# Patient Record
Sex: Female | Born: 1955 | Race: White | Hispanic: No | Marital: Single | Smoking: Former smoker
Health system: Southern US, Community
[De-identification: ages and names within clinical notes are randomized; demographics above are authoritative.]

## PROBLEM LIST (undated history)

## (undated) DIAGNOSIS — F329 Major depressive disorder, single episode, unspecified: Secondary | ICD-10-CM

## (undated) DIAGNOSIS — F32A Depression, unspecified: Secondary | ICD-10-CM

## (undated) DIAGNOSIS — F988 Other specified behavioral and emotional disorders with onset usually occurring in childhood and adolescence: Secondary | ICD-10-CM

## (undated) HISTORY — PX: ABDOMINAL HYSTERECTOMY: SHX81

---

## 1997-06-25 ENCOUNTER — Inpatient Hospital Stay (HOSPITAL_COMMUNITY): Admission: AD | Admit: 1997-06-25 | Discharge: 1997-06-27 | Payer: Self-pay | Admitting: *Deleted

## 1997-08-08 ENCOUNTER — Ambulatory Visit (HOSPITAL_COMMUNITY): Admission: RE | Admit: 1997-08-08 | Discharge: 1997-08-08 | Payer: Self-pay | Admitting: Gynecology

## 1997-10-02 ENCOUNTER — Inpatient Hospital Stay (HOSPITAL_COMMUNITY): Admission: AD | Admit: 1997-10-02 | Discharge: 1997-10-09 | Payer: Self-pay | Admitting: Obstetrics and Gynecology

## 2001-02-13 ENCOUNTER — Other Ambulatory Visit: Admission: RE | Admit: 2001-02-13 | Discharge: 2001-02-13 | Payer: Self-pay | Admitting: *Deleted

## 2002-06-25 ENCOUNTER — Other Ambulatory Visit: Admission: RE | Admit: 2002-06-25 | Discharge: 2002-06-25 | Payer: Self-pay | Admitting: *Deleted

## 2004-12-20 ENCOUNTER — Encounter: Admission: RE | Admit: 2004-12-20 | Discharge: 2004-12-20 | Payer: Self-pay | Admitting: Orthopedic Surgery

## 2006-01-18 ENCOUNTER — Other Ambulatory Visit: Admission: RE | Admit: 2006-01-18 | Discharge: 2006-01-18 | Payer: Self-pay | Admitting: Gynecology

## 2006-02-05 ENCOUNTER — Encounter: Admission: RE | Admit: 2006-02-05 | Discharge: 2006-02-05 | Payer: Self-pay | Admitting: Gynecology

## 2009-05-08 ENCOUNTER — Emergency Department (HOSPITAL_COMMUNITY): Admission: EM | Admit: 2009-05-08 | Discharge: 2009-05-08 | Payer: Self-pay | Admitting: Family Medicine

## 2010-05-24 ENCOUNTER — Ambulatory Visit (HOSPITAL_BASED_OUTPATIENT_CLINIC_OR_DEPARTMENT_OTHER)
Admission: RE | Admit: 2010-05-24 | Discharge: 2010-05-24 | Disposition: A | Discharge status: Home / Self Care | Payer: 59 | Source: Ambulatory Visit | Attending: Oral Surgery | Admitting: Oral Surgery

## 2010-05-24 ENCOUNTER — Other Ambulatory Visit: Payer: Self-pay | Admitting: Oral Surgery

## 2010-05-24 DIAGNOSIS — K006 Disturbances in tooth eruption: Secondary | ICD-10-CM | POA: Insufficient documentation

## 2010-05-24 DIAGNOSIS — K090 Developmental odontogenic cysts: Secondary | ICD-10-CM | POA: Insufficient documentation

## 2010-05-24 DIAGNOSIS — F411 Generalized anxiety disorder: Secondary | ICD-10-CM | POA: Insufficient documentation

## 2010-05-24 DIAGNOSIS — Z01812 Encounter for preprocedural laboratory examination: Secondary | ICD-10-CM | POA: Insufficient documentation

## 2010-05-24 DIAGNOSIS — K219 Gastro-esophageal reflux disease without esophagitis: Secondary | ICD-10-CM | POA: Insufficient documentation

## 2010-05-24 DIAGNOSIS — G43909 Migraine, unspecified, not intractable, without status migrainosus: Secondary | ICD-10-CM | POA: Insufficient documentation

## 2010-05-24 DIAGNOSIS — Z01810 Encounter for preprocedural cardiovascular examination: Secondary | ICD-10-CM | POA: Insufficient documentation

## 2010-05-24 LAB — POCT HEMOGLOBIN-HEMACUE: Hemoglobin: 12.7 g/dL (ref 12.0–15.0)

## 2010-06-05 NOTE — Op Note (Signed)
  NAMEKECHIA, YAHNKE           ACCOUNT NO.:  [id number]  MEDICAL RECORD NO.:  [id number]            PATIENT TYPE:  LOCATION:                                 FACILITY:  PHYSICIAN:  Hinton Dyer, D.D.S.    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  PROCEDURES: 1. Surgical removal of odontogenic tumor. 2. Surgical removal of impacted wisdom tooth. 3. Packing of nose.  SURGEON:  Hinton Dyer, DDS  ANESTHESIA:  General.  ASSISTANTS: 1. Montel Culver, DOMA 2. Windy Kalata, DOMA.  CONDITION AFTER SURGERY:  Good.  ESTIMATED BLOOD LOSS:  150 mL.  DESCRIPTION OF PROCEDURE:  The patient was brought and placed in a supine position in which she remained throughout the whole procedure. She was intubated via right nasal endotracheal tube and prepped and draped in the usual fashion for an intraoral procedure.  The mouth was suctioned out and a moist open 4 x 4 gauze was placed around the endotracheal tube.  Two carpules of 2% Xylocaine with 1:100,000 epinephrine was given in the mucobuccal fold and palate.  A #15 blade then made an incision over the edentulous area extending to tooth #12. A full-thickness mucoperiosteal flap was elevated with a periosteal elevator.  Immediately, a large perforation was noted into the maxillary sinus.  The bone that was present was abnormal in texture.  That was removed with a rongeur.  A large brownish colored thick material was encountered.  This was suctioned out.  A 3-4 cm x 3-4 cm tumor was removed that was attached within the maxillary sinus.  The impacted wisdom tooth was associated and in the center of this tumor.  The soft tissue lining of the maxillary sinus was then removed at the same time and all of that was sent to pathology.  The area was irrigated copiously and  examined for any remnants of soft tissue.  The flap was then repositioned and closed with multiple 4-0 Vicryl sutures.  Because of the extensive bleeding coming from the nose which was coming from the left maxillary sinus, the area was packed with Telfa covered in bacitracin.  One 2-0 black silk suture was passed through the septum and tied off to hold the packing in place.  The patient was then extubated on the table and returned to the recovery room in good condition.  She is going to be kept on amoxicillin 500 mg x28, one q.i.d. and hydrocodone 5/500 x20, one or two q.4 h. p.r.n. pain.  She will be followed by me in my private office and possible further surgery will be discussed.          ______________________________ Hinton Dyer, D.D.S.     JLM/MEDQ  D:  05/24/2010  T:  05/25/2010  Job:  [id number]  Electronically Signed by Royston Sinner D.D.S. on 06/05/2010 12:53:29 PM

## 2010-07-13 ENCOUNTER — Other Ambulatory Visit: Payer: Self-pay | Admitting: Oral Surgery

## 2010-07-13 ENCOUNTER — Ambulatory Visit (HOSPITAL_BASED_OUTPATIENT_CLINIC_OR_DEPARTMENT_OTHER)
Admission: RE | Admit: 2010-07-13 | Discharge: 2010-07-13 | Disposition: A | Discharge status: Home / Self Care | Payer: 59 | Source: Ambulatory Visit | Attending: Oral Surgery | Admitting: Oral Surgery

## 2010-07-13 DIAGNOSIS — K090 Developmental odontogenic cysts: Secondary | ICD-10-CM | POA: Insufficient documentation

## 2010-07-13 DIAGNOSIS — F329 Major depressive disorder, single episode, unspecified: Secondary | ICD-10-CM | POA: Insufficient documentation

## 2010-07-13 DIAGNOSIS — F3289 Other specified depressive episodes: Secondary | ICD-10-CM | POA: Insufficient documentation

## 2010-07-13 DIAGNOSIS — F411 Generalized anxiety disorder: Secondary | ICD-10-CM | POA: Insufficient documentation

## 2010-07-13 DIAGNOSIS — Z01812 Encounter for preprocedural laboratory examination: Secondary | ICD-10-CM | POA: Insufficient documentation

## 2010-07-13 LAB — POCT HEMOGLOBIN-HEMACUE: Hemoglobin: 11.9 g/dL — ABNORMAL LOW (ref 12.0–15.0)

## 2010-12-07 NOTE — Op Note (Signed)
Donna Todd, Donna Todd           ACCOUNT NO.:  [id number]  MEDICAL RECORD NO.:  [id number]          PATIENT TYPE:  LOCATION:                                 FACILITY:  PHYSICIAN:  Hinton Dyer, D.D.S.DATE OF BIRTH:  1955/07/10  DATE OF PROCEDURE:  07/13/2010 DATE OF DISCHARGE:                              OPERATIVE REPORT   PREOPERATIVE DIAGNOSIS:  Odontogenic keratocyst.  POSTOPERATIVE DIAGNOSIS:  Odontogenic keratocyst.  PROCEDURE:  Debridement of left sinus and chemical cautery with coronoid solution.  SURGEON:  Hinton Dyer, DDS  ASSISTANTS:  Angelia Mould and Montel Culver.  ANESTHESIA:  General.  ESTIMATED BLOOD LOSS:  30 mL.  Condition and surgery was good.  Following preop medication, the patient was brought and placed in supine position which remained throughout the whole procedure.  She was then intubated via right nasal endotracheal tube and prepped and draped in the usual fashion for an intraoral procedure.  The face and intraoral cavity were prepared with Betadine scrub and paint and the throat and endotracheal tube was protected with an open moist 4x4 gauze.  Xylocaine 1.5 mL of 2% with 1:100,000 epinephrine was given in the left mucobuccal fold from the canine area back to the wisdom tooth area.  A chronic oral antral opening was visualized and excised using an 15 blade and an Adson forceps.  The incision was carried from the distal of the left maxillary tuberosity to the left canine area.  A full-thickness buccal flap was elevated with a periosteal elevator.  The contents of the fistula as well as all soft tissue encountered within the sinus was submitted for pathological examination.  The sinus was curetted out and tissue was removed.  The distal tissue appeared more fibrous in nature with some sinus membrane.  The anterior portion had polypoid tissue.  All of this was submitted for pathological examination.  Once sinus was completely clean, the  area was irrigated out and checked for any residual tissue. It was then filled with coronoid solution approximately 5 mL.  It was left in place for 5 minutes by the clock.  The solution was then removed using suction.  The area was then copiously irrigated with several hundred mL of saline.  During the time that the solution was within the sinus, the mouth was also packed off.  One-half-inch Iodoform gauze mixed with Benzoin and Neosporin was then packed into the sinus from the distal aspect forward until the whole sinus was obliterated.  A small punch incision was made in the canine area and a hemostat was used to retrieve the end of the gauze and bring it out into the mucobuccal fold. A 4-0 Vicryl suture was then used to suture the whole defect closed so there was no longer an oral antral opening and all the soft tissue was sealed.  The end of the gauze was also sutured in place with a 4-0 Vicryl suture.  The mouth was irrigated out and suctioned, the throat was suctioned out, the nasopharynx was suctioned out and the throat pack was removed.  The whole area was also copiously irrigated prior to suctioning and after the packing was  removed.  The patient was then extubated on the table and returned to the recovery room in good condition.  She will be sent home with a prescription for Keflex 500 mg x28 one q.i.d., Vicodin x 20, one or two q.4 h. p.r.n. pain and Mucinex, she will have the pack advance then partially remove starting Monday.          ______________________________ Hinton Dyer, D.D.S.     JLM/MEDQ  D:  07/13/2010  T:  07/13/2010  Job:  [id number]  Electronically Signed by Royston Sinner D.D.S. on 12/07/2010 10:58:07 AM

## 2013-12-17 DIAGNOSIS — M549 Dorsalgia, unspecified: Secondary | ICD-10-CM | POA: Insufficient documentation

## 2013-12-17 DIAGNOSIS — G4700 Insomnia, unspecified: Secondary | ICD-10-CM | POA: Insufficient documentation

## 2013-12-17 DIAGNOSIS — R300 Dysuria: Secondary | ICD-10-CM | POA: Insufficient documentation

## 2013-12-17 DIAGNOSIS — F17200 Nicotine dependence, unspecified, uncomplicated: Secondary | ICD-10-CM | POA: Insufficient documentation

## 2013-12-17 DIAGNOSIS — F32A Depression, unspecified: Secondary | ICD-10-CM | POA: Insufficient documentation

## 2013-12-17 DIAGNOSIS — K219 Gastro-esophageal reflux disease without esophagitis: Secondary | ICD-10-CM | POA: Insufficient documentation

## 2013-12-17 DIAGNOSIS — N390 Urinary tract infection, site not specified: Secondary | ICD-10-CM | POA: Insufficient documentation

## 2013-12-17 DIAGNOSIS — N952 Postmenopausal atrophic vaginitis: Secondary | ICD-10-CM | POA: Insufficient documentation

## 2013-12-17 DIAGNOSIS — E663 Overweight: Secondary | ICD-10-CM | POA: Insufficient documentation

## 2013-12-17 DIAGNOSIS — F520 Hypoactive sexual desire disorder: Secondary | ICD-10-CM | POA: Insufficient documentation

## 2013-12-17 DIAGNOSIS — M25511 Pain in right shoulder: Secondary | ICD-10-CM | POA: Insufficient documentation

## 2013-12-17 DIAGNOSIS — M5412 Radiculopathy, cervical region: Secondary | ICD-10-CM | POA: Insufficient documentation

## 2013-12-17 DIAGNOSIS — F4321 Adjustment disorder with depressed mood: Secondary | ICD-10-CM | POA: Insufficient documentation

## 2013-12-17 DIAGNOSIS — E8940 Asymptomatic postprocedural ovarian failure: Secondary | ICD-10-CM | POA: Insufficient documentation

## 2013-12-17 DIAGNOSIS — N951 Menopausal and female climacteric states: Secondary | ICD-10-CM | POA: Insufficient documentation

## 2013-12-17 DIAGNOSIS — B009 Herpesviral infection, unspecified: Secondary | ICD-10-CM | POA: Insufficient documentation

## 2013-12-17 DIAGNOSIS — B3731 Acute candidiasis of vulva and vagina: Secondary | ICD-10-CM | POA: Insufficient documentation

## 2013-12-17 DIAGNOSIS — M79609 Pain in unspecified limb: Secondary | ICD-10-CM | POA: Insufficient documentation

## 2013-12-17 DIAGNOSIS — R12 Heartburn: Secondary | ICD-10-CM | POA: Insufficient documentation

## 2013-12-17 DIAGNOSIS — M2040 Other hammer toe(s) (acquired), unspecified foot: Secondary | ICD-10-CM | POA: Insufficient documentation

## 2015-03-14 DIAGNOSIS — N6089 Other benign mammary dysplasias of unspecified breast: Secondary | ICD-10-CM | POA: Insufficient documentation

## 2015-03-14 DIAGNOSIS — L84 Corns and callosities: Secondary | ICD-10-CM | POA: Insufficient documentation

## 2015-03-14 DIAGNOSIS — M7502 Adhesive capsulitis of left shoulder: Secondary | ICD-10-CM | POA: Insufficient documentation

## 2015-03-14 DIAGNOSIS — J309 Allergic rhinitis, unspecified: Secondary | ICD-10-CM | POA: Insufficient documentation

## 2015-03-14 DIAGNOSIS — M19019 Primary osteoarthritis, unspecified shoulder: Secondary | ICD-10-CM | POA: Insufficient documentation

## 2015-03-14 DIAGNOSIS — G8929 Other chronic pain: Secondary | ICD-10-CM | POA: Insufficient documentation

## 2015-03-14 DIAGNOSIS — K645 Perianal venous thrombosis: Secondary | ICD-10-CM | POA: Insufficient documentation

## 2015-03-14 DIAGNOSIS — F419 Anxiety disorder, unspecified: Secondary | ICD-10-CM | POA: Insufficient documentation

## 2015-06-28 DIAGNOSIS — F4322 Adjustment disorder with anxiety: Secondary | ICD-10-CM | POA: Insufficient documentation

## 2016-01-12 DIAGNOSIS — B9689 Other specified bacterial agents as the cause of diseases classified elsewhere: Secondary | ICD-10-CM | POA: Insufficient documentation

## 2016-01-12 DIAGNOSIS — N760 Acute vaginitis: Secondary | ICD-10-CM | POA: Insufficient documentation

## 2016-04-06 DIAGNOSIS — Z0000 Encounter for general adult medical examination without abnormal findings: Secondary | ICD-10-CM | POA: Insufficient documentation

## 2016-05-31 DIAGNOSIS — G44309 Post-traumatic headache, unspecified, not intractable: Secondary | ICD-10-CM | POA: Insufficient documentation

## 2016-08-15 DIAGNOSIS — Z111 Encounter for screening for respiratory tuberculosis: Secondary | ICD-10-CM | POA: Insufficient documentation

## 2016-08-31 ENCOUNTER — Other Ambulatory Visit: Payer: Self-pay | Admitting: Internal Medicine

## 2016-08-31 ENCOUNTER — Ambulatory Visit
Admission: RE | Admit: 2016-08-31 | Discharge: 2016-08-31 | Disposition: A | Discharge status: Home / Self Care | Payer: No Typology Code available for payment source | Source: Ambulatory Visit | Attending: Internal Medicine | Admitting: Internal Medicine

## 2016-08-31 DIAGNOSIS — Z111 Encounter for screening for respiratory tuberculosis: Secondary | ICD-10-CM

## 2017-07-17 DIAGNOSIS — F988 Other specified behavioral and emotional disorders with onset usually occurring in childhood and adolescence: Secondary | ICD-10-CM | POA: Insufficient documentation

## 2017-11-17 ENCOUNTER — Other Ambulatory Visit: Payer: Self-pay

## 2017-11-17 ENCOUNTER — Emergency Department (HOSPITAL_COMMUNITY): Payer: 59

## 2017-11-17 ENCOUNTER — Observation Stay (HOSPITAL_COMMUNITY)
Admission: EM | Admit: 2017-11-17 | Discharge: 2017-11-18 | Disposition: A | Discharge status: Home / Self Care | Payer: 59 | Attending: Internal Medicine | Admitting: Internal Medicine

## 2017-11-17 ENCOUNTER — Encounter (HOSPITAL_COMMUNITY): Payer: Self-pay

## 2017-11-17 DIAGNOSIS — F17210 Nicotine dependence, cigarettes, uncomplicated: Secondary | ICD-10-CM | POA: Diagnosis not present

## 2017-11-17 DIAGNOSIS — F988 Other specified behavioral and emotional disorders with onset usually occurring in childhood and adolescence: Secondary | ICD-10-CM | POA: Diagnosis not present

## 2017-11-17 DIAGNOSIS — R748 Abnormal levels of other serum enzymes: Secondary | ICD-10-CM | POA: Diagnosis not present

## 2017-11-17 DIAGNOSIS — Z79890 Hormone replacement therapy (postmenopausal): Secondary | ICD-10-CM | POA: Insufficient documentation

## 2017-11-17 DIAGNOSIS — R112 Nausea with vomiting, unspecified: Secondary | ICD-10-CM

## 2017-11-17 DIAGNOSIS — Z720 Tobacco use: Secondary | ICD-10-CM

## 2017-11-17 DIAGNOSIS — F329 Major depressive disorder, single episode, unspecified: Secondary | ICD-10-CM | POA: Diagnosis not present

## 2017-11-17 DIAGNOSIS — Z780 Asymptomatic menopausal state: Secondary | ICD-10-CM | POA: Diagnosis not present

## 2017-11-17 DIAGNOSIS — F1290 Cannabis use, unspecified, uncomplicated: Secondary | ICD-10-CM | POA: Insufficient documentation

## 2017-11-17 DIAGNOSIS — R7989 Other specified abnormal findings of blood chemistry: Principal | ICD-10-CM | POA: Insufficient documentation

## 2017-11-17 DIAGNOSIS — R778 Other specified abnormalities of plasma proteins: Secondary | ICD-10-CM | POA: Diagnosis present

## 2017-11-17 DIAGNOSIS — R911 Solitary pulmonary nodule: Secondary | ICD-10-CM | POA: Diagnosis not present

## 2017-11-17 DIAGNOSIS — Z79899 Other long term (current) drug therapy: Secondary | ICD-10-CM | POA: Insufficient documentation

## 2017-11-17 DIAGNOSIS — Z90710 Acquired absence of both cervix and uterus: Secondary | ICD-10-CM | POA: Diagnosis not present

## 2017-11-17 DIAGNOSIS — Z882 Allergy status to sulfonamides status: Secondary | ICD-10-CM | POA: Diagnosis not present

## 2017-11-17 DIAGNOSIS — I214 Non-ST elevation (NSTEMI) myocardial infarction: Secondary | ICD-10-CM

## 2017-11-17 HISTORY — DX: Other specified behavioral and emotional disorders with onset usually occurring in childhood and adolescence: F98.8

## 2017-11-17 HISTORY — DX: Depression, unspecified: F32.A

## 2017-11-17 HISTORY — DX: Major depressive disorder, single episode, unspecified: F32.9

## 2017-11-17 LAB — CBC
HCT: 40.7 % (ref 36.0–46.0)
Hemoglobin: 13.1 g/dL (ref 12.0–15.0)
MCH: 30.3 pg (ref 26.0–34.0)
MCHC: 32.2 g/dL (ref 30.0–36.0)
MCV: 94 fL (ref 78.0–100.0)
Platelets: 304 10*3/uL (ref 150–400)
RBC: 4.33 MIL/uL (ref 3.87–5.11)
RDW: 13.3 % (ref 11.5–15.5)
WBC: 12.5 10*3/uL — ABNORMAL HIGH (ref 4.0–10.5)

## 2017-11-17 LAB — RAPID URINE DRUG SCREEN, HOSP PERFORMED
Amphetamines: NOT DETECTED
Barbiturates: NOT DETECTED
Benzodiazepines: NOT DETECTED
Cocaine: NOT DETECTED
Opiates: NOT DETECTED
Tetrahydrocannabinol: POSITIVE — AB

## 2017-11-17 LAB — COMPREHENSIVE METABOLIC PANEL
ALT: 16 U/L (ref 0–44)
AST: 24 U/L (ref 15–41)
Albumin: 4.1 g/dL (ref 3.5–5.0)
Alkaline Phosphatase: 49 U/L (ref 38–126)
Anion gap: 9 (ref 5–15)
BUN: 15 mg/dL (ref 8–23)
CO2: 24 mmol/L (ref 22–32)
Calcium: 9.2 mg/dL (ref 8.9–10.3)
Chloride: 108 mmol/L (ref 98–111)
Creatinine, Ser: 0.86 mg/dL (ref 0.44–1.00)
GFR calc Af Amer: >60 mL/min (ref >60)
GFR calc non Af Amer: >60 mL/min (ref >60)
Glucose, Bld: 121 mg/dL — ABNORMAL HIGH (ref 70–99)
Potassium: 3.6 mmol/L (ref 3.5–5.1)
Sodium: 141 mmol/L (ref 135–145)
Total Bilirubin: 0.9 mg/dL (ref 0.3–1.2)
Total Protein: 7 g/dL (ref 6.5–8.1)

## 2017-11-17 LAB — I-STAT TROPONIN, ED
Troponin i, poc: 0.12 ng/mL (ref 0.00–0.08)
Troponin i, poc: 0.16 ng/mL (ref 0.00–0.08)

## 2017-11-17 LAB — TROPONIN I
Troponin I: 0.12 ng/mL (ref <0.03)
Troponin I: 0.11 ng/mL (ref <0.03)

## 2017-11-17 LAB — HEPARIN LEVEL (UNFRACTIONATED): Heparin Unfractionated: 0.41 IU/mL (ref 0.30–0.70)

## 2017-11-17 LAB — CBG MONITORING, ED: Glucose-Capillary: 115 mg/dL — ABNORMAL HIGH (ref 70–99)

## 2017-11-17 MED ORDER — ASPIRIN 81 MG PO CHEW
324.0000 mg | CHEWABLE_TABLET | Freq: Once | ORAL | Status: AC
  Administered 2017-11-17: 324 mg via ORAL
  Filled 2017-11-17: qty 4

## 2017-11-17 MED ORDER — ONDANSETRON HCL 4 MG/2ML IJ SOLN
4.0000 mg | Freq: Four times a day (QID) | INTRAMUSCULAR | Status: DC | PRN
  Administered 2017-11-17 – 2017-11-18 (×3): 4 mg via INTRAVENOUS
  Filled 2017-11-17 (×3): qty 2

## 2017-11-17 MED ORDER — MORPHINE SULFATE (PF) 2 MG/ML IV SOLN: 2.0000 mg | INTRAVENOUS | Status: DC | PRN

## 2017-11-17 MED ORDER — HEPARIN (PORCINE) IN NACL 100-0.45 UNIT/ML-% IJ SOLN
750.0000 [IU]/h | INTRAMUSCULAR | Status: DC
  Administered 2017-11-17: 650 [IU]/h via INTRAVENOUS
  Filled 2017-11-17: qty 250

## 2017-11-17 MED ORDER — ACETAMINOPHEN 325 MG PO TABS: 650.0000 mg | ORAL_TABLET | ORAL | Status: DC | PRN

## 2017-11-17 MED ORDER — ASPIRIN EC 325 MG PO TBEC
325.0000 mg | DELAYED_RELEASE_TABLET | Freq: Every day | ORAL | Status: DC
  Administered 2017-11-17 – 2017-11-18 (×2): 325 mg via ORAL
  Filled 2017-11-17 (×2): qty 1

## 2017-11-17 MED ORDER — IOPAMIDOL (ISOVUE-370) INJECTION 76%
INTRAVENOUS | Status: AC
  Administered 2017-11-17: 100 mL
  Filled 2017-11-17: qty 100

## 2017-11-17 MED ORDER — PANTOPRAZOLE SODIUM 40 MG PO TBEC
40.0000 mg | DELAYED_RELEASE_TABLET | Freq: Every day | ORAL | Status: DC
  Administered 2017-11-17 – 2017-11-18 (×2): 40 mg via ORAL
  Filled 2017-11-17 (×2): qty 1

## 2017-11-17 MED ORDER — HEPARIN BOLUS VIA INFUSION
3000.0000 [IU] | Freq: Once | INTRAVENOUS | Status: AC
  Administered 2017-11-17: 3000 [IU] via INTRAVENOUS
  Filled 2017-11-17: qty 3000

## 2017-11-17 MED ORDER — DULOXETINE HCL 30 MG PO CPEP
30.0000 mg | ORAL_CAPSULE | Freq: Every day | ORAL | Status: DC
  Administered 2017-11-18: 30 mg via ORAL
  Filled 2017-11-17: qty 1

## 2017-11-17 MED ORDER — METOPROLOL TARTRATE 25 MG PO TABS
25.0000 mg | ORAL_TABLET | Freq: Two times a day (BID) | ORAL | Status: DC
  Filled 2017-11-17: qty 1

## 2017-11-17 MED ORDER — GI COCKTAIL ~~LOC~~: 30.0000 mL | Freq: Four times a day (QID) | ORAL | Status: DC | PRN

## 2017-11-17 NOTE — ED Notes (Signed)
Admitting team bedside for evaluation and planning

## 2017-11-17 NOTE — Consult Note (Addendum)
Cardiology Consultation:   Patient ID: Donna Todd; [id number]; September 08, 1955   Admit date: 11/17/2017 Date of Consult: 11/17/2017  Primary Care Provider: Elisabeth Cara, PA-C Primary Cardiologist: New to [facility]   Patient Profile:   Donna Todd is a 62 y.o. female with a history of depression and attention deficit disorder as well as tobacco abuse who is being seen today for the evaluation of abnormal troponin I level at the request of Dr. Ronnald Nian.  History of Present Illness:   Ms. Tomson presents to the ER with her sister and significant other, having been referred there after visit to urgent care earlier today.  She states that she went out to eat with some friends on Friday evening, had [place] food and other alcoholic beverages. She contacted her significant other around midnight and suddenly became nauseated with an episode of emesis.  She states that she has had recurring nausea, has actually forced emesis since Saturday and into today.  She does not report any actual chest discomfort, breathlessness, or palpitations during this time.  States that she is felt weak.  She has not eaten any food since Friday evening.  She was seen at [facility] she was diagnosed with potential gastroenteritis and prescribed Zofran.  ECG is described as showing sinus rhythm at rate 51 bpm with RSR prime in lead V1 and U waves.  Apparently troponin I level was drawn as part of her work-up with finding of abnormal range at 0.124 and she was contacted to come to the hospital.  Baseline she does not report any exertional symptoms or limitations.  She states that she works at [employer] job, mainly computer work.  She has no obvious family history of premature CAD, states that her mother did have "stents" in her 40s or 63s in the setting of other health problems.  Personal cardiac risk factors include tobacco abuse, possibly hyperlipidemia which she manages by  diet and also krill oil.  He is on an estrogen patch at home, also uses Ritalin intermittently and is on Cymbalta per mental health care provider.  I personally reviewed her ECG today which shows sinus bradycardia with nonspecific ST-T changes in the setting of some lead motion.  There is a small R' in lead V1 and V2. Also prolonged QT interval versus TU fusion.  There is no old tracing for comparison.  Chest CT obtained in the ER noted a right upper lobe 8 mm nodule with mild spiculation, otherwise no acute findings.  Thoracic aorta was described as normal range and there was mention of no atherosclerotic calcifications.  Otherwise no recent health changes other than reported UTI within the last 2 weeks treated with Cipro.  Past Medical History:  Diagnosis Date  . Attention deficit disorder   . Depression     Past Surgical History:  Procedure Laterality Date  . ABDOMINAL HYSTERECTOMY       Outpatient Medications: No current facility-administered medications on file prior to encounter.    Current Outpatient Medications on File Prior to Encounter  Medication Sig Dispense Refill  . Biotin w/ Vitamins C & E (HAIR/SKIN/NAILS PO) Take 1 tablet by mouth daily.    . DULoxetine (CYMBALTA) 30 MG capsule Take 30 mg by mouth daily.    Marland Kitchen estradiol (VIVELLE-DOT) 0.05 MG/24HR patch Place 1 patch onto the skin 2 (two) times a week.    Marland Kitchen KRILL OIL PO Take 1 tablet by mouth daily.    . ondansetron (  ZOFRAN-ODT) 4 MG disintegrating tablet Take 4-8 mg by mouth every 8 (eight) hours as needed for nausea.    Marland Kitchen omeprazole (PRILOSEC) 20 MG capsule Take 20 mg by mouth daily. For 7 days      Allergies:    Allergies  Allergen Reactions  . Sulfa Antibiotics Other (See Comments)    MOUTH ULCERS    Social History:   Social History   Socioeconomic History  . Marital status: Legally Separated    Spouse name: Not on file  . Number of children: Not on file  . Years of education: Not on file  . Highest  education level: Not on file  Occupational History  . Not on file  Social Needs  . Financial resource strain: Not on file  . Food insecurity:    Worry: Not on file    Inability: Not on file  . Transportation needs:    Medical: Not on file    Non-medical: Not on file  Tobacco Use  . Smoking status: Current Some Day Smoker    Packs/day: 1.00    Years: 25.00    Pack years: 25.00    Types: Cigarettes  . Smokeless tobacco: Never Used  Substance and Sexual Activity  . Alcohol use: Yes    Alcohol/week: 6.0 standard drinks    Types: 6 Cans of beer per week  . Drug use: Not on file  . Sexual activity: Not on file  Lifestyle  . Physical activity:    Days per week: Not on file    Minutes per session: Not on file  . Stress: Not on file  Relationships  . Social connections:    Talks on phone: Not on file    Gets together: Not on file    Attends religious service: Not on file    Active member of club or organization: Not on file    Attends meetings of clubs or organizations: Not on file    Relationship status: Not on file  . Intimate partner violence:    Fear of current or ex partner: Not on file    Emotionally abused: Not on file    Physically abused: Not on file    Forced sexual activity: Not on file  Other Topics Concern  . Not on file  Social History Narrative   Sedentary job works at a computer    Family History:   The patient's family history includes Diabetes Mellitus II in her mother; Skin cancer in her mother.  ROS:  Please see the history of present illness.  Reports trouble focusing for which she uses Ritalin intermittently.  All other ROS reviewed and negative.     Physical Exam/Data:   Vitals:   11/17/17 1235 11/17/17 1330 11/17/17 1400 11/17/17 1430  BP: (!) 148/76 (!) 150/70 (!) 144/78 (!) 153/68  Pulse: (!) 51 (!) 51 (!) 51 (!) 55  Resp: 12 11 12    Temp: 99.3 F (37.4 C)     TempSrc: Oral     SpO2: 100%     Weight: 56.2 kg     Height: 5\' 4"  (1.626  m)      No intake or output data in the 24 hours ending 11/17/17 1709 Filed Weights   11/17/17 1235  Weight: 56.2 kg   Body mass index is 21.28 kg/m.   Gen: Patient appears comfortable at rest. HEENT: Conjunctiva and lids normal, oropharynx clear. Neck: Supple, no elevated JVP or carotid bruits, no thyromegaly. Lungs: Clear to auscultation, nonlabored breathing  at rest. Cardiac: Regular rate and rhythm, no S3 or significant systolic murmur, no pericardial rub. Abdomen: Soft, nontender, bowel sounds present, no guarding or rebound. Extremities: No pitting edema, distal pulses 2+. Skin: Warm and dry. Musculoskeletal: No kyphosis. Neuropsychiatric: Alert and oriented x3, affect grossly appropriate.  Telemetry:  I personally reviewed telemetry which shows sinus rhythm.  Relevant CV Studies:  No prior cardiac testing.  Laboratory Data:  Chemistry Recent Labs  Lab 11/17/17 1243  NA 141  K 3.6  CL 108  CO2 24  GLUCOSE 121*  BUN 15  CREATININE 0.86  CALCIUM 9.2  GFRNONAA >60  GFRAA >60  ANIONGAP 9    Recent Labs  Lab 11/17/17 1243  PROT 7.0  ALBUMIN 4.1  AST 24  ALT 16  ALKPHOS 49  BILITOT 0.9   Hematology Recent Labs  Lab 11/17/17 1243  WBC 12.5*  RBC 4.33  HGB 13.1  HCT 40.7  MCV 94.0  MCH 30.3  MCHC 32.2  RDW 13.3  PLT 304   Cardiac EnzymesNo results for input(s): TROPONINI in the last 168 hours.  Recent Labs  Lab 11/17/17 1249 11/17/17 1555  TROPIPOC 0.12* 0.16*     Radiology/Studies:  Ct Angio Chest Pe W Or Wo Contrast  Result Date: 11/17/2017 CLINICAL DATA:  Nausea and vomiting for several days EXAM: CT ANGIOGRAPHY CHEST WITH CONTRAST TECHNIQUE: Multidetector CT imaging of the chest was performed using the standard protocol during bolus administration of intravenous contrast. Multiplanar CT image reconstructions and MIPs were obtained to evaluate the vascular anatomy. CONTRAST:  132mL ISOVUE-370 IOPAMIDOL (ISOVUE-370) INJECTION 76%  COMPARISON:  None. FINDINGS: Cardiovascular: Thoracic aorta is within normal limits. No atherosclerotic calcifications are seen. No cardiac enlargement is noted. The pulmonary artery shows a normal branching pattern. No filling defects are identified to suggest pulmonary emboli. Mediastinum/Nodes: Thoracic inlet is within normal limits. No hilar or mediastinal adenopathy is noted. The esophagus is within normal limits. Lungs/Pleura: Lungs are well aerated bilaterally. In the right upper lobe there is a 8 mm mean diameter nodule with mild spiculation identified. No other nodular changes are noted. No focal infiltrate or sizable effusion is seen. Upper Abdomen: Visualized upper abdomen is within normal limits. Musculoskeletal: Bony structures are within normal limits. Review of the MIP images confirms the above findings. IMPRESSION: 8 mm mildly spiculated nodule in the right upper lobe as described. Non-contrast chest CT at 6-12 months is recommended. If the nodule is stable at time of repeat CT, then future CT at 18-24 months (from today's scan) is considered optional for low-risk patients, but is recommended for high-risk patients. This recommendation follows the consensus statement: Guidelines for Management of Incidental Pulmonary Nodules Detected on CT Images: From the Fleischner Society 2017; Radiology 2017; 284:228-243. Electronically Signed   By: Inez Catalina M.D.   On: 11/17/2017 15:24   Dg Chest Portable 1 View  Result Date: 11/17/2017 CLINICAL DATA:  Nausea/vomiting EXAM: PORTABLE CHEST 1 VIEW COMPARISON:  04/04/2017 FINDINGS: Lungs are clear.  No pleural effusion or pneumothorax. The heart is normal in size. Old left rib fracture deformities. IMPRESSION: Normal chest radiographs. Electronically Signed   By: Julian Hy M.D.   On: 11/17/2017 13:03    Assessment and Plan:   1.  Patient referred to ER after evaluation in urgent care earlier today for recent onset nausea and emesis due to  finding of abnormal troponin I levels.  Currently measurements are 0.12 and 0.16.  She is hemodynamically stable with no chest pain  at this time and her ECG is overall nonspecific.  At present the current pattern is not suggestive of ACS and her symptoms are atypical overall.  Chest CTA did not report any significant atherosclerotic calcification.  She does have a history of tobacco abuse and possibly hyperlipidemia but otherwise no known major cardiac risk factors.  She does not mention any specific illicit substance use recently.  She had been out with friends eating and having some alcoholic beverages prior to the onset of symptoms, otherwise no provocation.  2.  Tobacco abuse.  3.  Reported history of attention deficit disorder, uses Ritalin intermittently.  4.  Pro;onged QT versus TU fusion, no old tracing available for comparison.  She is on Cymbalta as an outpatient.  5.  Incidentally noted right upper lobe 8 mm lung nodule with mild spiculation.  Concerning in the setting of known tobacco abuse.  Further work-up per primary team.  Patient is being admitted to the internal medicine service for further work-up.  Would cycle a full set of cardiac markers, follow-up ECG in the morning, and also obtain an echocardiogram to define cardiac structure and function.  At this point ACS is not specifically suspected based on her presenting symptoms and current troponin I level/trend.  Consider UDS.  Our service will continue to follow with you and make additional recommendations once more information has been obtained.  Signed, Rozann Lesches, MD  11/17/2017 5:09 PM

## 2017-11-17 NOTE — Progress Notes (Signed)
ANTICOAGULATION CONSULT NOTE - Initial Consult  Pharmacy Consult for Heparin Indication: NSTEMI  Allergies  Allergen Reactions  . Sulfa Antibiotics     Patient Measurements: Height: 5\' 4"  (162.6 cm) Weight: 124 lb (56.2 kg) IBW/kg (Calculated) : 54.7 Heparin Dosing Weight: 56.2 kg  Vital Signs: Temp: 99.3 F (37.4 C) (09/15 1235) Temp Source: Oral (09/15 1235) BP: 148/76 (09/15 1235) Pulse Rate: 51 (09/15 1235)  Labs: Recent Labs    11/17/17 1243  HGB 13.1  HCT 40.7  PLT 304    CrCl cannot be calculated (No successful lab value found.).   Medical History: History reviewed. No pertinent past medical history.  Medications:  Scheduled:   Assessment: 82 YOF who presents on 11/17/17 from urgent care due to elevated troponin of 0.12. EMS states EKG was normal. Patient had been complaining of nausea for past several days. Denied any SOB, chest pain. On estrogen therapy PTA for h/o hysterectomy. Given ASA 324 mg x1 in ED. No s/sx of bleeding noted. CBC normal.  Goal of Therapy:  Heparin level 0.3-0.7 units/ml Monitor platelets by anticoagulation protocol: Yes   Plan:  -Give 3000 units bolus x 1 -Start heparin infusion at 650 units/hr -Check 6 hour heparin level -Continue to monitor H&H and platelets  Donna Todd 11/17/2017,1:23 PM

## 2017-11-17 NOTE — ED Triage Notes (Signed)
Pt arrives to ED from home with complaints of nausea and vomiting for several days. EMS reports pt saw Urgent Care yesterday and lab work was done as well as given zofran. Pt received call today stating her troponin was 0.124 and for her to come to the hospital. EMS states EKG was normal, copy placed in medical records. Pt placed in position of comfort with bed locked and lowered, call bell in reach.

## 2017-11-17 NOTE — ED Provider Notes (Addendum)
[facility]ENC[facility] DEPARTMENT Provider Note   CSN: [id number] Arrival date & time: 11/17/17  1218     History   Chief Complaint Chief Complaint  Patient presents with  . Abnormal Lab    HPI Donna Todd is a 62 y.o. female.  Patient with nausea for the last several days.  Has a history of smoking but went to urgent care today as she has felt nauseous after drinking alcohol Friday night.  Denies any diarrhea.  No abdominal pain.  Patient had elevated troponin at urgent care and was sent for further evaluation.  Patient denies any shortness of breath, chest pain.  She is on estrogen therapy after having hysterectomy in the past.  The history is provided by the patient.  Illness  This is a new problem. The current episode started more than 2 days ago. The problem occurs daily. The problem has not changed since onset.Pertinent negatives include no chest pain, no abdominal pain, no headaches and no shortness of breath. Nothing aggravates the symptoms. Nothing relieves the symptoms. She has tried nothing for the symptoms. The treatment provided no relief.    History reviewed. No pertinent past medical history.  There are no active problems to display for this patient.   Past Surgical History:  Procedure Laterality Date  . ABDOMINAL HYSTERECTOMY       OB History   None      Home Medications    Prior to Admission medications   Medication Sig Start Date End Date Taking? Authorizing Provider  Biotin w/ Vitamins C & E (HAIR/SKIN/NAILS PO) Take 1 tablet by mouth daily.   Yes [provider]  DULoxetine (CYMBALTA) 30 MG capsule Take 30 mg by mouth daily.   Yes [provider]  estradiol (VIVELLE-DOT) 0.05 MG/24HR patch Place 1 patch onto the skin 2 (two) times a week. 12/02/14  Yes [provider]  KRILL OIL PO Take 1 tablet by mouth daily.   Yes [provider]  ondansetron (ZOFRAN-ODT) 4 MG disintegrating tablet Take  4-8 mg by mouth every 8 (eight) hours as needed for nausea. 11/17/17  Yes [provider]  omeprazole (PRILOSEC) 20 MG capsule Take 20 mg by mouth daily. For 7 days 11/17/17 11/24/17  [provider]    Family History History reviewed. No pertinent family history.  Social History Social History   Tobacco Use  . Smoking status: Current Some Day Smoker  . Smokeless tobacco: Never Used  Substance Use Topics  . Alcohol use: Yes    Alcohol/week: 6.0 standard drinks    Types: 6 Cans of beer per week  . Drug use: Not on file     Allergies   Sulfa antibiotics   Review of Systems Review of Systems  Constitutional: Negative for chills and fever.  HENT: Negative for ear pain and sore throat.   Eyes: Negative for pain and visual disturbance.  Respiratory: Negative for cough and shortness of breath.   Cardiovascular: Negative for chest pain and palpitations.  Gastrointestinal: Positive for nausea. Negative for abdominal pain and vomiting.  Genitourinary: Negative for dysuria and hematuria.  Musculoskeletal: Negative for arthralgias and back pain.  Skin: Negative for color change and rash.  Neurological: Negative for seizures, syncope and headaches.  All other systems reviewed and are negative.    Physical Exam Updated Vital Signs BP (!) 153/68   Pulse (!) 55   Temp 99.3 F (37.4 C) (Oral)   Resp 12   Ht 5\' 4"  (  1.626 m)   Wt 56.2 kg   SpO2 100%   BMI 21.28 kg/m   Physical Exam  Constitutional: She is oriented to person, place, and time. She appears well-developed and well-nourished. No distress.  HENT:  Head: Normocephalic and atraumatic.  Eyes: Pupils are equal, round, and reactive to light. Conjunctivae and EOM are normal.  Neck: Normal range of motion. Neck supple.  Cardiovascular: Normal rate, regular rhythm, normal heart sounds and intact distal pulses.  No murmur heard. Pulmonary/Chest: Effort normal and breath sounds normal. No respiratory  distress.  Abdominal: Soft. There is no tenderness.  Musculoskeletal: Normal range of motion. She exhibits no edema.  Neurological: She is alert and oriented to person, place, and time.  Skin: Skin is warm and dry. Capillary refill takes less than 2 seconds.  Psychiatric: She has a normal mood and affect.  Nursing note and vitals reviewed.    ED Treatments / Results  Labs (all labs ordered are listed, but only abnormal results are displayed) Labs Reviewed  CBC - Abnormal; Notable for the following components:      Result Value   WBC 12.5 (*)    All other components within normal limits  COMPREHENSIVE METABOLIC PANEL - Abnormal; Notable for the following components:   Glucose, Bld 121 (*)    All other components within normal limits  CBG MONITORING, ED - Abnormal; Notable for the following components:   Glucose-Capillary 115 (*)    All other components within normal limits  I-STAT TROPONIN, ED - Abnormal; Notable for the following components:   Troponin i, poc 0.12 (*)    All other components within normal limits  I-STAT TROPONIN, ED - Abnormal; Notable for the following components:   Troponin i, poc 0.16 (*)    All other components within normal limits  HEPARIN LEVEL (UNFRACTIONATED)  RAPID URINE DRUG SCREEN, HOSP PERFORMED    EKG EKG Interpretation  Date/Time:  Sunday November 17 2017 12:33:40 EDT Ventricular Rate:  51 PR Interval:    QRS Duration: 100 QT Interval:  522 QTC Calculation: 481 R Axis:   60 Text Interpretation:  Sinus rhythm Consider left atrial enlargement RSR' in V1 or V2, right VCD or RVH Confirmed by Lennice Sites [phone]) on 11/17/2017 12:58:51 PM   Radiology Ct Angio Chest Pe W Or Wo Contrast  Result Date: 11/17/2017 CLINICAL DATA:  Nausea and vomiting for several days EXAM: CT ANGIOGRAPHY CHEST WITH CONTRAST TECHNIQUE: Multidetector CT imaging of the chest was performed using the standard protocol during bolus administration of intravenous  contrast. Multiplanar CT image reconstructions and MIPs were obtained to evaluate the vascular anatomy. CONTRAST:  140mL ISOVUE-370 IOPAMIDOL (ISOVUE-370) INJECTION 76% COMPARISON:  None. FINDINGS: Cardiovascular: Thoracic aorta is within normal limits. No atherosclerotic calcifications are seen. No cardiac enlargement is noted. The pulmonary artery shows a normal branching pattern. No filling defects are identified to suggest pulmonary emboli. Mediastinum/Nodes: Thoracic inlet is within normal limits. No hilar or mediastinal adenopathy is noted. The esophagus is within normal limits. Lungs/Pleura: Lungs are well aerated bilaterally. In the right upper lobe there is a 8 mm mean diameter nodule with mild spiculation identified. No other nodular changes are noted. No focal infiltrate or sizable effusion is seen. Upper Abdomen: Visualized upper abdomen is within normal limits. Musculoskeletal: Bony structures are within normal limits. Review of the MIP images confirms the above findings. IMPRESSION: 8 mm mildly spiculated nodule in the right upper lobe as described. Non-contrast chest CT at 6-12 months is recommended. If  the nodule is stable at time of repeat CT, then future CT at 18-24 months (from today's scan) is considered optional for low-risk patients, but is recommended for high-risk patients. This recommendation follows the consensus statement: Guidelines for Management of Incidental Pulmonary Nodules Detected on CT Images: From the Fleischner Society 2017; Radiology 2017; 284:228-243. Electronically Signed   By: Inez Catalina M.D.   On: 11/17/2017 15:24   Dg Chest Portable 1 View  Result Date: 11/17/2017 CLINICAL DATA:  Nausea/vomiting EXAM: PORTABLE CHEST 1 VIEW COMPARISON:  04/04/2017 FINDINGS: Lungs are clear.  No pleural effusion or pneumothorax. The heart is normal in size. Old left rib fracture deformities. IMPRESSION: Normal chest radiographs. Electronically Signed   By: Julian Hy M.D.   On:  11/17/2017 13:03    Procedures .Critical Care Performed by: Lennice Sites, DO Authorized by: Lennice Sites, DO   Critical care provider statement:    Critical care time (minutes):  35   Critical care time was exclusive of:  Separately billable procedures and treating other patients and teaching time   Critical care was necessary to treat or prevent imminent or life-threatening deterioration of the following conditions:  Cardiac failure   Critical care was time spent personally by me on the following activities:  Development of treatment plan with patient or surrogate, discussions with consultants, discussions with primary provider, evaluation of patient's response to treatment, examination of patient, ordering and performing treatments and interventions, ordering and review of laboratory studies, ordering and review of radiographic studies, pulse oximetry, re-evaluation of patient's condition and review of old charts   I assumed direction of critical care for this patient from another provider in my specialty: no     (including critical care time)  Medications Ordered in ED Medications  heparin ADULT infusion 100 units/mL (25000 units/248mL sodium chloride 0.45%) (650 Units/hr Intravenous New Bag/Given 11/17/17 1359)  aspirin chewable tablet 324 mg (324 mg Oral Given 11/17/17 1250)  heparin bolus via infusion 3,000 Units (3,000 Units Intravenous Bolus from Bag 11/17/17 1402)  iopamidol (ISOVUE-370) 76 % injection (100 mLs  Contrast Given 11/17/17 1436)     Initial Impression / Assessment and Plan / ED Course  I have reviewed the triage vital signs and the nursing notes.  Pertinent labs & imaging results that were available during my care of the patient were reviewed by me and considered in my medical decision making (see chart for details).     Donna Todd is a 62 year old female with no significant medical history who presents to the ED from outside urgent care with elevated  troponin.  Patient with unremarkable vitals.  No fever.  Patient has had some general malaise, nausea over the last several days.  Went to an urgent care and they found that she had an elevated troponin.  EKG shows sinus rhythm.  No signs of ischemic changes.  Repeat troponin is elevated as well.  Patient is on estrogen as she has had a hysterectomy in the past.  She does not complain of any chest pain, shortness of breath.  No abdominal pain.  Exam is overall unremarkable.  CT PE study was ordered given history of estrogen elevated troponin but did not show any acute PE.  Patient does have pulmonary nodule.  She is a smoker.  No significant family history of cardiac disease.  Patient otherwise had no significant anemia, electrolyte abnormality, kidney injury.  Chest x-ray showed no signs of pneumonia, pneumothorax, pleural effusion.  Patient denies any drug use including  cocaine.  She does drink alcohol socially.  Cardiology was consulted given elevated troponin and recommends admission to medicine.  Patient was started on IV heparin for NSTEMI.  She was given 324 mg of aspirin and admitted to the hospital in stable condition.  Repeat troponin continues to be elevated.  This chart was dictated using voice recognition software.  Despite best efforts to proofread,  errors can occur which can change the documentation meaning.  Final Clinical Impressions(s) / ED Diagnoses   Final diagnoses:  NSTEMI (non-ST elevated myocardial infarction) [facility])    ED Discharge Orders    None       Lennice Sites, DO 11/17/17 Brantley, [place], DO 11/17/17 1724

## 2017-11-17 NOTE — H&P (Signed)
History and Physical    Donna Todd MPN:[id number] DOB: 1955/11/10 DOA: 11/17/2017  PCP: Elisabeth Cara, PA-C  Patient coming from: Home sent in by urgent care center  I have personally briefly reviewed patient's old medical records [facility]  Chief Complaint: Sent in by urgent care center due to abnormal troponin  HPI: Donna Todd is a 62 y.o. female with medical history significant of visit disorder, hypercholesterolemia, hysterectomy on an estrogen patch who presents with complaints of nausea and vomiting.  She was seen at the urgent care center where a troponin was obtained.  She was discharged from the urgent care center and when the troponin came back abnormal they called her and sent her into the emergency department.  He states that she went to the urgent care center because she is been nauseous and vomiting general malaise since Friday.  Symptoms began Friday after she went out with some friends and had a glass of wine.  [place] barbecue and some type food.  He had picked up a stomach bug.  Better she went to urgent care today and these abnormal labs were discovered.  She currently denies any chest pain she is just had some malaise but no shortness of breath no left arm numbness no neck pain no back pain.  Only symptoms are nausea which is not relieved with IV or IM ondansetron and only relieved with a sublingual form.  The patient does occasionally take Reglan and took some this week to help her focus at work.  She has a diagnosis of adult attention deficit hyperactivity disorder.  She also takes Cymbalta for depression she occasionally uses Krill oil for her cholesterol. ED Course: Troponin initially 0.12 now 0.16 in our emergency department EKG is unremarkable chest x-ray is normal, CT scan of the chest was obtained and revealed an 8 mm spiculated lung nodule.  Heparin drip was begun cardiology consulted as well as triad hospitalist for admission.  Review of  Systems: As per HPI otherwise all other systems reviewed and  negative.    Past Medical History:  Diagnosis Date  . Attention deficit disorder   . Depression     Past Surgical History:  Procedure Laterality Date  . ABDOMINAL HYSTERECTOMY      Social History   Social History Narrative   Sedentary job works at a computer     reports that she has been smoking cigarettes. She has a 25.00 pack-year smoking history. She has never used smokeless tobacco. She reports that she drinks about 6.0 standard drinks of alcohol per week. Her drug history is not on file.  Allergies  Allergen Reactions  . Sulfa Antibiotics Other (See Comments)    MOUTH ULCERS    Family History  Problem Relation Age of Onset  . Skin cancer Mother   . Diabetes Mellitus II Mother     Prior to Admission medications   Medication Sig Start Date End Date Taking? Authorizing Provider  Biotin w/ Vitamins C & E (HAIR/SKIN/NAILS PO) Take 1 tablet by mouth daily.   Yes [provider]  DULoxetine (CYMBALTA) 30 MG capsule Take 30 mg by mouth daily.   Yes [provider]  estradiol (VIVELLE-DOT) 0.05 MG/24HR patch Place 1 patch onto the skin 2 (two) times a week. 12/02/14  Yes [provider]  KRILL OIL PO Take 1 tablet by mouth daily.   Yes [provider]  ondansetron (ZOFRAN-ODT) 4 MG disintegrating tablet Take 4-8 mg by mouth every 8 (eight)  hours as needed for nausea. 11/17/17  Yes [provider]  omeprazole (PRILOSEC) 20 MG capsule Take 20 mg by mouth daily. For 7 days 11/17/17 11/24/17  [provider]    Physical Exam:  Constitutional: NAD, calm, comfortable Vitals:   11/17/17 1235 11/17/17 1330 11/17/17 1400 11/17/17 1430  BP: (!) 148/76 (!) 150/70 (!) 144/78 (!) 153/68  Pulse: (!) 51 (!) 51 (!) 51 (!) 55  Resp: 12 11 12    Temp: 99.3 F (37.4 C)     TempSrc: Oral     SpO2: 100%     Weight: 56.2 kg     Height: 5\' 4"  (1.626 m)      Eyes: PERRL, lids  and conjunctivae normal ENMT: Mucous membranes are dry. Posterior pharynx clear of any exudate or lesions.Normal dentition.  Neck: normal, supple, no masses, no thyromegaly Respiratory: clear to auscultation bilaterally, no wheezing, no crackles. Normal respiratory effort. No accessory muscle use.  Cardiovascular: Regular rate and rhythm, no murmurs / rubs / gallops. No extremity edema. 2+ pedal pulses. No carotid bruits.  Abdomen: no tenderness, no masses palpated. No hepatosplenomegaly. Bowel sounds positive.  Musculoskeletal: no clubbing / cyanosis. No joint deformity upper and lower extremities. Good ROM, no contractures. Normal muscle tone.  Skin: no rashes, lesions, ulcers. No induration Neurologic: CN 2-12 grossly intact. Sensation intact, DTR normal. Strength 5/5 in all 4.  Psychiatric: Normal judgment and insight. Alert and oriented x 3. Normal mood.    Labs on Admission: I have personally reviewed following labs and imaging studies  CBC: Recent Labs  Lab 11/17/17 1243  WBC 12.5*  HGB 13.1  HCT 40.7  MCV 94.0  PLT 419   Basic Metabolic Panel: Recent Labs  Lab 11/17/17 1243  NA 141  K 3.6  CL 108  CO2 24  GLUCOSE 121*  BUN 15  CREATININE 0.86  CALCIUM 9.2   GFR: Estimated Creatinine Clearance: 58.6 mL/min (by C-G formula based on SCr of 0.86 mg/dL). Liver Function Tests: Recent Labs  Lab 11/17/17 1243  AST 24  ALT 16  ALKPHOS 49  BILITOT 0.9  PROT 7.0  ALBUMIN 4.1   CBG: Recent Labs  Lab 11/17/17 1251  GLUCAP 115*    Radiological Exams on Admission: Ct Angio Chest Pe W Or Wo Contrast  Result Date: 11/17/2017 CLINICAL DATA:  Nausea and vomiting for several days EXAM: CT ANGIOGRAPHY CHEST WITH CONTRAST TECHNIQUE: Multidetector CT imaging of the chest was performed using the standard protocol during bolus administration of intravenous contrast. Multiplanar CT image reconstructions and MIPs were obtained to evaluate the vascular anatomy. CONTRAST:   183mL ISOVUE-370 IOPAMIDOL (ISOVUE-370) INJECTION 76% COMPARISON:  None. FINDINGS: Cardiovascular: Thoracic aorta is within normal limits. No atherosclerotic calcifications are seen. No cardiac enlargement is noted. The pulmonary artery shows a normal branching pattern. No filling defects are identified to suggest pulmonary emboli. Mediastinum/Nodes: Thoracic inlet is within normal limits. No hilar or mediastinal adenopathy is noted. The esophagus is within normal limits. Lungs/Pleura: Lungs are well aerated bilaterally. In the right upper lobe there is a 8 mm mean diameter nodule with mild spiculation identified. No other nodular changes are noted. No focal infiltrate or sizable effusion is seen. Upper Abdomen: Visualized upper abdomen is within normal limits. Musculoskeletal: Bony structures are within normal limits. Review of the MIP images confirms the above findings. IMPRESSION: 8 mm mildly spiculated nodule in the right upper lobe as described. Non-contrast chest CT at 6-12 months is recommended. If the nodule is  stable at time of repeat CT, then future CT at 18-24 months (from today's scan) is considered optional for low-risk patients, but is recommended for high-risk patients. This recommendation follows the consensus statement: Guidelines for Management of Incidental Pulmonary Nodules Detected on CT Images: From the Fleischner Society 2017; Radiology 2017; 284:228-243. Electronically Signed   By: Inez Catalina M.D.   On: 11/17/2017 15:24   Dg Chest Portable 1 View  Result Date: 11/17/2017 CLINICAL DATA:  Nausea/vomiting EXAM: PORTABLE CHEST 1 VIEW COMPARISON:  04/04/2017 FINDINGS: Lungs are clear.  No pleural effusion or pneumothorax. The heart is normal in size. Old left rib fracture deformities. IMPRESSION: Normal chest radiographs. Electronically Signed   By: Julian Hy M.D.   On: 11/17/2017 13:03    EKG: Independently reviewed.  Sinus rhythm with left atrial enlargement, RSR prime in V1,  right ventricular conduction delay, right ventricular hypertrophy.  Assessment/Plan Principal Problem:   Elevated troponin Active Problems:   Nausea with vomiting   Incidental lung nodule, greater than or equal to 89mm   Tobacco abuse   Attention deficit disorder (ADD) in adult   1.  Elevated troponin: In a patient without chest pain but with nausea.  This is certainly very atypical.  We will continue to cycle enzymes and check an echocardiogram.  Dr. Domenic Polite from cardiology has seen the patient and his advice and recommendations is very much appreciated.  We will continue current heparin drip.  Repeat EKG in the morning.  Further plans per cardiology in a.m.  2.  Nausea with vomiting: This may be patient's chest pain equivalent.  We will continue to treat with antiemetics and antacids.  Doubt gastrointestinal etiology at this point as patient has had no diarrhea.  3.  8 mm incidental lung nodule: Noted in the right upper lobe: It is spiculated.  Patient will need repeat CT scan in the next 3 months.  He is at high risk because she is a smoker.  4.  Tobacco abuse: Patient encouraged to continue smoking cessation I spent 6 minutes discussing smoking cessation with her.  5.  Attention deficit disorder in adult: Patient uses Ritalin.  Not sure if this is contributing to her present situation.  DVT prophylaxis: Heparin drip Code Status: Full code Family Communication: Sister and friend who are present at the time of admission Disposition Plan: Likely home in 24 to 48 hours Consults called: Cardiology Dr. Domenic Polite Admission status: Observation   Lady Deutscher MD [facility] Pager [phone]  If 7PM-7AM, please contact night-coverage [email or web address] Password [facility]  11/17/2017, 5:16 PM

## 2017-11-17 NOTE — Plan of Care (Signed)
Lab called troponin 0.12, text paged Dr. Evangeline Gula.  MD returned page.  Orders to monitor and report any further labs.

## 2017-11-17 NOTE — Progress Notes (Signed)
[place] for Heparin Indication: NSTEMI  Allergies  Allergen Reactions  . Sulfa Antibiotics Other (See Comments)    MOUTH ULCERS    Patient Measurements: Height: 5\' 4"  (162.6 cm) Weight: 124 lb (56.2 kg) IBW/kg (Calculated) : 54.7 Heparin Dosing Weight: 56.2 kg  Vital Signs: Temp: 99.2 F (37.3 C) (09/15 1837) Temp Source: Oral (09/15 1837) BP: 150/75 (09/15 1837) Pulse Rate: 49 (09/15 1837)  Labs: Recent Labs    11/17/17 1243 11/17/17 1718 11/17/17 2027  HGB 13.1  --   --   HCT 40.7  --   --   PLT 304  --   --   HEPARINUNFRC  --   --  0.41  CREATININE 0.86  --   --   TROPONINI  --  0.12*  --     Estimated Creatinine Clearance: 58.6 mL/min (by C-G formula based on SCr of 0.86 mg/dL).   Medical History: Past Medical History:  Diagnosis Date  . Attention deficit disorder   . Depression     Assessment: 41 YOF who presents on 11/17/17 from urgent care due to elevated troponin of 0.12. EMS states EKG was normal. Patient had been complaining of nausea for past several days. Denied any SOB, chest pain. On estrogen therapy PTA for h/o hysterectomy. Given ASA 324 mg x1 in ED.   Initial heparin level came back therapeutic at 0.41, on 650 units/hr. Hgb 13.1, plt 304. Troponin 0.16>0.12. No s/sx of bleeding. No infusion issues.   Goal of Therapy:  Heparin level 0.3-0.7 units/ml Monitor platelets by anticoagulation protocol: Yes   Plan:  -Continue heparin infusion at 650 units/hr -Check confirmatory 6 hour heparin level -Continue to monitor H&H and platelets  Doylene Canard, PharmD Clinical Pharmacist  Pager: [phone] Phone: [phone] 11/17/2017,9:15 PM

## 2017-11-18 ENCOUNTER — Other Ambulatory Visit: Payer: Self-pay | Admitting: Cardiology

## 2017-11-18 ENCOUNTER — Observation Stay (HOSPITAL_BASED_OUTPATIENT_CLINIC_OR_DEPARTMENT_OTHER): Payer: 59

## 2017-11-18 DIAGNOSIS — R911 Solitary pulmonary nodule: Secondary | ICD-10-CM | POA: Diagnosis not present

## 2017-11-18 DIAGNOSIS — F988 Other specified behavioral and emotional disorders with onset usually occurring in childhood and adolescence: Secondary | ICD-10-CM | POA: Diagnosis not present

## 2017-11-18 DIAGNOSIS — R079 Chest pain, unspecified: Secondary | ICD-10-CM

## 2017-11-18 DIAGNOSIS — R748 Abnormal levels of other serum enzymes: Secondary | ICD-10-CM | POA: Diagnosis not present

## 2017-11-18 DIAGNOSIS — R7989 Other specified abnormal findings of blood chemistry: Principal | ICD-10-CM

## 2017-11-18 DIAGNOSIS — R778 Other specified abnormalities of plasma proteins: Secondary | ICD-10-CM

## 2017-11-18 DIAGNOSIS — R112 Nausea with vomiting, unspecified: Secondary | ICD-10-CM | POA: Diagnosis not present

## 2017-11-18 LAB — CBC
HCT: 37.9 % (ref 36.0–46.0)
Hemoglobin: 12.5 g/dL (ref 12.0–15.0)
MCH: 30.6 pg (ref 26.0–34.0)
MCHC: 33 g/dL (ref 30.0–36.0)
MCV: 92.7 fL (ref 78.0–100.0)
Platelets: 260 10*3/uL (ref 150–400)
RBC: 4.09 MIL/uL (ref 3.87–5.11)
RDW: 13.1 % (ref 11.5–15.5)
WBC: 10 10*3/uL (ref 4.0–10.5)

## 2017-11-18 LAB — ECHOCARDIOGRAM COMPLETE
Height: 64 in
Weight: 1984 oz

## 2017-11-18 LAB — TROPONIN I: Troponin I: 0.1 ng/mL (ref <0.03)

## 2017-11-18 LAB — HIV ANTIBODY (ROUTINE TESTING W REFLEX): HIV Screen 4th Generation wRfx: NONREACTIVE

## 2017-11-18 LAB — HEPARIN LEVEL (UNFRACTIONATED): Heparin Unfractionated: 0.25 IU/mL — ABNORMAL LOW (ref 0.30–0.70)

## 2017-11-18 MED ORDER — ONDANSETRON 4 MG PO TBDP
4.0000 mg | ORAL_TABLET | Freq: Four times a day (QID) | ORAL | Status: DC | PRN
  Administered 2017-11-18: 4 mg via ORAL
  Filled 2017-11-18: qty 1

## 2017-11-18 MED ORDER — PROMETHAZINE HCL 25 MG RE SUPP: 25.0000 mg | Freq: Four times a day (QID) | RECTAL | 0 refills | Status: DC | PRN

## 2017-11-18 MED ORDER — ONDANSETRON 4 MG PO TBDP
4.0000 mg | ORAL_TABLET | Freq: Three times a day (TID) | ORAL | Status: DC | PRN
  Administered 2017-11-18: 4 mg via ORAL
  Filled 2017-11-18: qty 1

## 2017-11-18 MED ORDER — ONDANSETRON 4 MG PO TBDP: 4.0000 mg | ORAL_TABLET | Freq: Three times a day (TID) | ORAL | 0 refills | Status: DC | PRN

## 2017-11-18 NOTE — Progress Notes (Signed)
  Echocardiogram 2D Echocardiogram has been performed.  Rhesa Forsberg G Agustin Swatek 11/18/2017, 9:58 AM

## 2017-11-18 NOTE — Progress Notes (Signed)
Progress Note  Patient Name: Donna Todd Date of Encounter: 11/18/2017  Primary Cardiologist: No primary care provider on file.  New ([place])  Subjective   No chest pain, persistent nausea. Elevated cardiac troponin I around 0.11. On his bradycardia.  There are no ischemic changes.  The QT interval is normal but there are prominent U waves.  Inpatient Medications    Scheduled Meds: . aspirin EC  325 mg Oral Daily  . DULoxetine  30 mg Oral Daily  . metoprolol tartrate  25 mg Oral BID  . pantoprazole  40 mg Oral Daily   Continuous Infusions: . heparin 750 Units/hr (11/18/17 0739)   PRN Meds: acetaminophen, gi cocktail, morphine injection, ondansetron   Vital Signs    Vitals:   11/17/17 1837 11/17/17 2239 11/17/17 2346 11/18/17 0536  BP: (!) 150/75 (!) 148/76 136/76 127/80  Pulse: (!) 49 (!) 51 (!) 51 (!) 51  Resp: 16  16 18   Temp: 99.2 F (37.3 C)  98.5 F (36.9 C) 98.6 F (37 C)  TempSrc: Oral  Oral Oral  SpO2: 99%  98% 100%  Weight:      Height:       No intake or output data in the 24 hours ending 11/18/17 1002 Filed Weights   11/17/17 1235  Weight: 56.2 kg    Telemetry    Has bradycardia- Personally Reviewed  ECG    Sinus bradycardia, no ischemic changes, normal QT interval but there are prominent Q waves.- Personally Reviewed  Physical Exam   GEN: No acute distress.   Neck: No JVD Cardiac: RRR, no murmurs, rubs, or gallops.  Respiratory: Clear to auscultation bilaterally. GI: Soft, nontender, non-distended  MS: No edema; No deformity. Neuro:  Nonfocal  Psych: Normal affect   Labs    Chemistry Recent Labs  Lab 11/17/17 1243  NA 141  K 3.6  CL 108  CO2 24  GLUCOSE 121*  BUN 15  CREATININE 0.86  CALCIUM 9.2  PROT 7.0  ALBUMIN 4.1  AST 24  ALT 16  ALKPHOS 49  BILITOT 0.9  GFRNONAA >60  GFRAA >60  ANIONGAP 9     Hematology Recent Labs  Lab 11/17/17 1243 11/18/17 0613  WBC 12.5* 10.0  RBC 4.33 4.09  HGB 13.1  12.5  HCT 40.7 37.9  MCV 94.0 92.7  MCH 30.3 30.6  MCHC 32.2 33.0  RDW 13.3 13.1  PLT 304 260    Cardiac Enzymes Recent Labs  Lab 11/17/17 1718 11/17/17 2027 11/17/17 2309  TROPONINI 0.12* 0.11* 0.10*    Recent Labs  Lab 11/17/17 1249 11/17/17 1555  TROPIPOC 0.12* 0.16*     BNPNo results for input(s): BNP, PROBNP in the last 168 hours.   DDimer No results for input(s): DDIMER in the last 168 hours.   Radiology    Ct Angio Chest Pe W Or Wo Contrast  Result Date: 11/17/2017 CLINICAL DATA:  Nausea and vomiting for several days EXAM: CT ANGIOGRAPHY CHEST WITH CONTRAST TECHNIQUE: Multidetector CT imaging of the chest was performed using the standard protocol during bolus administration of intravenous contrast. Multiplanar CT image reconstructions and MIPs were obtained to evaluate the vascular anatomy. CONTRAST:  127mL ISOVUE-370 IOPAMIDOL (ISOVUE-370) INJECTION 76% COMPARISON:  None. FINDINGS: Cardiovascular: Thoracic aorta is within normal limits. No atherosclerotic calcifications are seen. No cardiac enlargement is noted. The pulmonary artery shows a normal branching pattern. No filling defects are identified to suggest pulmonary emboli. Mediastinum/Nodes: Thoracic inlet is within normal limits. No hilar or  mediastinal adenopathy is noted. The esophagus is within normal limits. Lungs/Pleura: Lungs are well aerated bilaterally. In the right upper lobe there is a 8 mm mean diameter nodule with mild spiculation identified. No other nodular changes are noted. No focal infiltrate or sizable effusion is seen. Upper Abdomen: Visualized upper abdomen is within normal limits. Musculoskeletal: Bony structures are within normal limits. Review of the MIP images confirms the above findings. IMPRESSION: 8 mm mildly spiculated nodule in the right upper lobe as described. Non-contrast chest CT at 6-12 months is recommended. If the nodule is stable at time of repeat CT, then future CT at 18-24 months  (from today's scan) is considered optional for low-risk patients, but is recommended for high-risk patients. This recommendation follows the consensus statement: Guidelines for Management of Incidental Pulmonary Nodules Detected on CT Images: From the Fleischner Society 2017; Radiology 2017; 284:228-243. Electronically Signed   By: Inez Catalina M.D.   On: 11/17/2017 15:24   Dg Chest Portable 1 View  Result Date: 11/17/2017 CLINICAL DATA:  Nausea/vomiting EXAM: PORTABLE CHEST 1 VIEW COMPARISON:  04/04/2017 FINDINGS: Lungs are clear.  No pleural effusion or pneumothorax. The heart is normal in size. Old left rib fracture deformities. IMPRESSION: Normal chest radiographs. Electronically Signed   By: Julian Hy M.D.   On: 11/17/2017 13:03    Cardiac Studies   Echo November 18, 2017 - Left ventricle: The cavity size was normal. Wall thickness was normal. Systolic function was normal. The estimated ejection fraction was in the range of 60% to 65%. Wall motion was normal; there were no regional wall motion abnormalities. Left ventricular diastolic function parameters were normal.   Patient Profile     62 y.o. female with elevated troponin I and persistent nausea, background of tobacco abuse and adult ADD  Assessment & Plan    1.  Elevated troponin: The pattern is flat, not typical for an acute coronary syndrome but she has ongoing symptoms.  Not a good candidate for coronary CTA since she just received a bolus of contrast for her CT yesterday.  We will schedule for [facility] today.  Echo is reassuring.  If Myoview is normal would not pursue further cardiac evaluation. 2.  8 mm spiculated right upper lobe lung nodule: Concerning in the setting of known tobacco use, further evaluation will be probably necessary.     For questions or updates, please contact [place] Please consult [email or web address] for contact info under        Signed, Sanda Klein, MD  11/18/2017, 10:02 AM

## 2017-11-18 NOTE — Progress Notes (Signed)
Pt requesting work note, Will contact provider

## 2017-11-18 NOTE — Progress Notes (Signed)
ANTICOAGULATION CONSULT NOTE  Pharmacy Consult for Heparin - Follow-Up Indication: NSTEMI  Patient Measurements: Height: 5\' 4"  (162.6 cm) Weight: 124 lb (56.2 kg) IBW/kg (Calculated) : 54.7 Heparin Dosing Weight: 56.2 kg  Vital Signs: Temp: 98.6 F (37 C) (09/16 0536) Temp Source: Oral (09/16 0536) BP: 127/80 (09/16 0536) Pulse Rate: 51 (09/16 0536)  Labs: Recent Labs    11/17/17 1243 11/17/17 1718 11/17/17 2027 11/17/17 2309 11/18/17 0613  HGB 13.1  --   --   --  12.5  HCT 40.7  --   --   --  37.9  PLT 304  --   --   --  260  HEPARINUNFRC  --   --  0.41  --  0.25*  CREATININE 0.86  --   --   --   --   TROPONINI  --  0.12* 0.11* 0.10*  --     Estimated Creatinine Clearance: 58.6 mL/min (by C-G formula based on SCr of 0.86 mg/dL).   Assessment: 8 YOF who presents on 11/17/17 from urgent care due to elevated troponin of 0.12. EMS states EKG was normal. Patient had been complaining of nausea for past several days. Denied any SOB, chest pain. On estrogen therapy PTA for h/o hysterectomy. Given ASA 324 mg x1 in ED. Pharmacy consulted to start Heparin for anticoagulation.   Heparin level this morning is slightly SUBtherapeutic (HL 0.25 << 0.41, goal of 0.3-0.7). CBC wnl and stable - no bleeding noted at this time.   Goal of Therapy:  Heparin level 0.3-0.7 units/ml Monitor platelets by anticoagulation protocol: Yes   Plan:  - Increase Heparin to 750 units/hr (7.5 ml/hr) - Will continue to monitor for any signs/symptoms of bleeding and will follow up with heparin level in 6 hours   Thank you for allowing pharmacy to be a part of this patient's care.  Alycia Rossetti, PharmD, BCPS Clinical Pharmacist Pager: [phone] Clinical phone for 11/18/2017 from 7a-3:30p: [phone] If after 3:30p, please call main pharmacy at: [phone] Please check AMION for all [place] numbers 11/18/2017 7:31 AM

## 2017-11-18 NOTE — Progress Notes (Addendum)
Pt oob to shower as directed by Dr. Eliseo Squires. Heparin briefly paused for 15 minutes with pharmacy aware. Denies chest pain but endorses continued nausea. This nurse tremain at door through shower.

## 2017-11-18 NOTE — Discharge Summary (Signed)
Physician Discharge Summary  Donna Todd BWI:[id number] DOB: 06/13/1955 DOA: 11/17/2017  PCP: Elisabeth Cara, PA-C  Admit date: 11/17/2017 Discharge date: 11/18/2017  Admitted From: home Discharge disposition: home   Recommendations for Outpatient Follow-Up:   Marijuana cessation Follow up of RUL lung nodule: per radiology:  Non-contrast chest CT at 6-12 months is recommended. If the nodule is stable at time of repeat CT, then future CT at 18-24 months (from today's scan) is considered optional for low-risk patients, but is recommended for high-risk patients.  Outpatient stress test Monitor BP, start medications if needed  Discharge Diagnosis:   Principal Problem:   Elevated troponin Active Problems:   Nausea with vomiting   Incidental lung nodule, greater than or equal to 24mm   Tobacco abuse   Attention deficit disorder (ADD) in adult    Discharge Condition: Improved.  Diet recommendation: Low sodium, heart healthy.    Wound care: None.  Code status: Full.   History of Present Illness:   Donna Todd is a 62 y.o. female with medical history significant of visit disorder, hypercholesterolemia, hysterectomy on an estrogen patch who presents with complaints of nausea and vomiting.  She was seen at the urgent care center where a troponin was obtained.  She was discharged from the urgent care center and when the troponin came back abnormal they called her and sent her into the emergency department.  He states that she went to the urgent care center because she is been nauseous and vomiting general malaise since Friday.  Symptoms began Friday after she went out with some friends and had a glass of wine.  [place] barbecue and some type food.  He had picked up a stomach bug.  Better she went to urgent care today and these abnormal labs were discovered.  She currently denies any chest pain she is just had some malaise but no shortness of breath no left arm  numbness no neck pain no back pain.  Only symptoms are nausea which is not relieved with IV or IM ondansetron and only relieved with a sublingual form.  The patient does occasionally take Reglan and took some this week to help her focus at work.  She has a diagnosis of adult attention deficit hyperactivity disorder.  She also takes Cymbalta for depression she occasionally uses Krill oil for her cholesterol.   Hospital Course by Problem:     Elevated troponin: The pattern is flat, not typical for an acute coronary syndrome. Echo is reassuring.  If Myoview is normal would not pursue further cardiac evaluation. Patient had coffee on AM of stress test so it was changed to outpatient   N/V -thought to be due to heavy marijuana usage -hot shower improved -PRN phenergan suppositories/zofran  8 mm spiculated right upper lobe lung nodule: Concerning in the setting of known tobacco use, further evaluation will be probably necessary    Medical Consultants:   cards   Discharge Exam:   Vitals:   11/18/17 0536 11/18/17 1015  BP: 127/80 (!) 153/87  Pulse: (!) 51 (!) 52  Resp: 18 18  Temp: 98.6 F (37 C) 99 F (37.2 C)  SpO2: 100% 98%   Vitals:   11/17/17 2239 11/17/17 2346 11/18/17 0536 11/18/17 1015  BP: (!) 148/76 136/76 127/80 (!) 153/87  Pulse: (!) 51 (!) 51 (!) 51 (!) 52  Resp:  16 18 18   Temp:  98.5 F (36.9 C) 98.6 F (37 C) 99 F (37.2 C)  TempSrc:  Oral Oral Oral  SpO2:  98% 100% 98%  Weight:      Height:        General exam: Appears calm and comfortable  The results of significant diagnostics from this hospitalization (including imaging, microbiology, ancillary and laboratory) are listed below for reference.     Procedures and Diagnostic Studies:   Ct Angio Chest Pe W Or Wo Contrast  Result Date: 11/17/2017 CLINICAL DATA:  Nausea and vomiting for several days EXAM: CT ANGIOGRAPHY CHEST WITH CONTRAST TECHNIQUE: Multidetector CT imaging of the chest was performed  using the standard protocol during bolus administration of intravenous contrast. Multiplanar CT image reconstructions and MIPs were obtained to evaluate the vascular anatomy. CONTRAST:  126mL ISOVUE-370 IOPAMIDOL (ISOVUE-370) INJECTION 76% COMPARISON:  None. FINDINGS: Cardiovascular: Thoracic aorta is within normal limits. No atherosclerotic calcifications are seen. No cardiac enlargement is noted. The pulmonary artery shows a normal branching pattern. No filling defects are identified to suggest pulmonary emboli. Mediastinum/Nodes: Thoracic inlet is within normal limits. No hilar or mediastinal adenopathy is noted. The esophagus is within normal limits. Lungs/Pleura: Lungs are well aerated bilaterally. In the right upper lobe there is a 8 mm mean diameter nodule with mild spiculation identified. No other nodular changes are noted. No focal infiltrate or sizable effusion is seen. Upper Abdomen: Visualized upper abdomen is within normal limits. Musculoskeletal: Bony structures are within normal limits. Review of the MIP images confirms the above findings. IMPRESSION: 8 mm mildly spiculated nodule in the right upper lobe as described. Non-contrast chest CT at 6-12 months is recommended. If the nodule is stable at time of repeat CT, then future CT at 18-24 months (from today's scan) is considered optional for low-risk patients, but is recommended for high-risk patients. This recommendation follows the consensus statement: Guidelines for Management of Incidental Pulmonary Nodules Detected on CT Images: From the Fleischner Society 2017; Radiology 2017; 284:228-243. Electronically Signed   By: Inez Catalina M.D.   On: 11/17/2017 15:24   Dg Chest Portable 1 View  Result Date: 11/17/2017 CLINICAL DATA:  Nausea/vomiting EXAM: PORTABLE CHEST 1 VIEW COMPARISON:  04/04/2017 FINDINGS: Lungs are clear.  No pleural effusion or pneumothorax. The heart is normal in size. Old left rib fracture deformities. IMPRESSION: Normal chest  radiographs. Electronically Signed   By: Julian Hy M.D.   On: 11/17/2017 13:03     Labs:   Basic Metabolic Panel: Recent Labs  Lab 11/17/17 1243  NA 141  K 3.6  CL 108  CO2 24  GLUCOSE 121*  BUN 15  CREATININE 0.86  CALCIUM 9.2   GFR Estimated Creatinine Clearance: 58.6 mL/min (by C-G formula based on SCr of 0.86 mg/dL). Liver Function Tests: Recent Labs  Lab 11/17/17 1243  AST 24  ALT 16  ALKPHOS 49  BILITOT 0.9  PROT 7.0  ALBUMIN 4.1   No results for input(s): LIPASE, AMYLASE in the last 168 hours. No results for input(s): AMMONIA in the last 168 hours. Coagulation profile No results for input(s): INR, PROTIME in the last 168 hours.  CBC: Recent Labs  Lab 11/17/17 1243 11/18/17 0613  WBC 12.5* 10.0  HGB 13.1 12.5  HCT 40.7 37.9  MCV 94.0 92.7  PLT 304 260   Cardiac Enzymes: Recent Labs  Lab 11/17/17 1718 11/17/17 2027 11/17/17 2309  TROPONINI 0.12* 0.11* 0.10*   BNP: Invalid input(s): POCBNP CBG: Recent Labs  Lab 11/17/17 1251  GLUCAP 115*   D-Dimer No results for input(s): DDIMER in the last 72 hours.  Hgb A1c No results for input(s): HGBA1C in the last 72 hours. Lipid Profile No results for input(s): CHOL, HDL, LDLCALC, TRIG, CHOLHDL, LDLDIRECT in the last 72 hours. Thyroid function studies No results for input(s): TSH, T4TOTAL, T3FREE, THYROIDAB in the last 72 hours.  Invalid input(s): FREET3 Anemia work up No results for input(s): VITAMINB12, FOLATE, FERRITIN, TIBC, IRON, RETICCTPCT in the last 72 hours. Microbiology No results found for this or any previous visit (from the past 240 hour(s)).   Discharge Instructions:   Discharge Instructions    Discharge instructions   Complete by:  As directed    Advance diet as tolerated Avoid marijuana--- PRN hot shower for nausea Outpatient stress test   Increase activity slowly   Complete by:  As directed      Allergies as of 11/18/2017      Reactions   Sulfa Antibiotics  Other (See Comments)   MOUTH ULCERS      Medication List    STOP taking these medications   methylphenidate 5 MG tablet Commonly known as:  RITALIN     TAKE these medications   DULoxetine 30 MG capsule Commonly known as:  CYMBALTA Take 30 mg by mouth daily.   estradiol 0.05 MG/24HR patch Commonly known as:  VIVELLE-DOT Place 1 patch onto the skin 2 (two) times a week.   HAIR/SKIN/NAILS PO Take 1 tablet by mouth daily.   KRILL OIL PO Take 1 tablet by mouth daily.   omeprazole 20 MG capsule Commonly known as:  PRILOSEC Take 20 mg by mouth daily. For 7 days   ondansetron 4 MG disintegrating tablet Commonly known as:  ZOFRAN-ODT Take 1-2 tablets (4-8 mg total) by mouth every 8 (eight) hours as needed for nausea.   promethazine 25 MG suppository Commonly known as:  PHENERGAN Place 1 suppository (25 mg total) rectally every 6 (six) hours as needed for nausea or vomiting.      Follow-up Information    [place], [place], PA-C Follow up in 1 week(s).   Specialty:  Family Medicine Contact information: [address] [place] [address] [phone]            Time coordinating discharge: 25 min  Signed:  Geradine Girt  [facility] 11/18/2017, 1:18 PM

## 2017-11-18 NOTE — Progress Notes (Signed)
Pt states her nausea has resolved. Denies chest pain and states she "feels good".

## 2017-11-18 NOTE — Progress Notes (Signed)
Pt states nausea imoproved after medication. IV de'c x 2 with sites now clear and no bleeding noted. DC instructions discussed ands written dc instructions given. Pt home stable via wc with sister.

## 2017-11-18 NOTE — Progress Notes (Signed)
Dr. Eliseo Squires contacted and states to follow with PMD  for further time off . Pt states her MD has given her out through Wednesday.

## 2017-11-18 NOTE — Discharge Instructions (Signed)
° °  You have a Stress Test scheduled at [facility]. Your doctor has ordered this test to check the blood flow in your heart arteries. ° °Please arrive 15 minutes early for paperwork. The whole test will take several hours. You may want to bring reading material to remain occupied while undergoing different parts of the test. ° °Instructions: °· No food/drink after midnight the night before. °· It is OK to take your morning meds with a sip of water EXCEPT for those types of medicines listed below or otherwise instructed. °· No caffeine/decaf products 24 hours before, including medicines such as Excedrin or Goody Powders. Call if there are any questions.  °· Wear comfortable clothes and shoes.  ° °Special Medication Instructions: °· Beta blockers such as metoprolol (Lopressor/Toprol XL), atenolol (Tenormin), carvedilol (Coreg), nebivolol (Bystolic), bisoprolol (Zebeta), propranolol (Inderal) should not be taken for 24 hours before the test. °· Calcium channel blockers such as diltiazem (Cardizem) or verapmil (Calan) should not be taken for 24 hours before the test. °· Remove nitroglycerin patches and do not take nitrate preparations such as Imdur/isosorbide the day of your test. °· No Persantine/Theophylline or Aggrenox medicines should be used within 24 hours of the test.  °· If you are diabetic, please ask which medications to hold the day of the test ° °What To Expect: °When you arrive in the lab, the technician will inject a small amount of radioactive tracer into your arm through an IV while you are resting quietly. This helps us to form pictures of your heart. You will likely only feel a sting from the IV. After a waiting period, resting pictures will be obtained under a big camera. These are the "before" pictures. ° °Next, you will be prepped for the stress portion of the test. This may include either walking on a treadmill or receiving a medicine that helps to dilate blood vessels in  your heart to simulate the effect of exercise on your heart. If you are walking on a treadmill, you will walk at different paces to try to get your heart rate to a goal number that is based on your age. If your doctor has chosen the pharmacologic test, then you will receive a medicine through your IV that may cause temporary nausea, flushing, shortness of breath and sometimes chest discomfort or vomiting. This is typically short-lived and usually resolves quickly. If you experience symptoms, that does not automatically mean the test is abnormal. Some patients do not experience any symptoms at all. Your blood pressure and heart rate will be monitored, and we will be watching your EKG on a computer screen for any changes. During this portion of the test, the radiologist will inject another small amount of radioactive tracer into your IV. After a waiting period, you will undergo a second set of pictures. These are the "after" pictures. ° °The doctor reading the test will compare the before-and-after images to look for evidence of heart blockages or heart weakness. The test usually takes 1 day to complete, but in certain instances (for example, if a patient is over a certain weight limit), the test may be done over the span of 2 days. ° ° °

## 2017-11-18 NOTE — Progress Notes (Signed)
   Stress test could not be done due to patient having coffee this morning. Per Dr. Sallyanne Kuster echo is reassuring. Will stop heparin. If pt is staying inpatient can reschedule stress test for tomorrow. If she is otherwise ready for discharge we can arrange for outpatient myoview.   Donna Todd, AGNP-C [facility] 11/18/2017  11:58 AM Pager: [phone]

## 2017-11-21 ENCOUNTER — Encounter (HOSPITAL_COMMUNITY): Payer: 59

## 2017-11-21 ENCOUNTER — Telehealth (HOSPITAL_COMMUNITY): Payer: Self-pay

## 2017-11-21 NOTE — Telephone Encounter (Signed)
Encounter complete. 

## 2017-11-26 ENCOUNTER — Ambulatory Visit (HOSPITAL_COMMUNITY)
Admission: RE | Admit: 2017-11-26 | Discharge: 2017-11-26 | Disposition: A | Discharge status: Home / Self Care | Payer: 59 | Source: Ambulatory Visit | Attending: Internal Medicine | Admitting: Internal Medicine

## 2017-11-26 DIAGNOSIS — R778 Other specified abnormalities of plasma proteins: Secondary | ICD-10-CM

## 2017-11-26 DIAGNOSIS — R7989 Other specified abnormal findings of blood chemistry: Secondary | ICD-10-CM

## 2017-11-26 DIAGNOSIS — R748 Abnormal levels of other serum enzymes: Secondary | ICD-10-CM | POA: Diagnosis present

## 2017-11-26 LAB — MYOCARDIAL PERFUSION IMAGING
LV dias vol: 81 mL (ref 46–106)
LV sys vol: 31 mL
Peak HR: 72 {beats}/min
Rest HR: 47 {beats}/min
SDS: 3
SRS: 3
SSS: 6
TID: 1.11

## 2017-11-26 MED ORDER — REGADENOSON 0.4 MG/5ML IV SOLN
0.4000 mg | Freq: Once | INTRAVENOUS | Status: AC
  Administered 2017-11-26: 0.4 mg via INTRAVENOUS

## 2017-11-26 MED ORDER — TECHNETIUM TC 99M TETROFOSMIN IV KIT
32.1000 | PACK | Freq: Once | INTRAVENOUS | Status: AC | PRN
  Administered 2017-11-26: 32.1 via INTRAVENOUS
  Filled 2017-11-26: qty 33

## 2017-11-26 MED ORDER — TECHNETIUM TC 99M TETROFOSMIN IV KIT
10.2000 | PACK | Freq: Once | INTRAVENOUS | Status: AC | PRN
  Administered 2017-11-26: 10.2 via INTRAVENOUS
  Filled 2017-11-26: qty 11

## 2017-11-27 ENCOUNTER — Telehealth: Payer: Self-pay | Admitting: Cardiology

## 2017-11-27 NOTE — Telephone Encounter (Signed)
New Message:     Pt wants to know if her Stress Test result is ready from yesterday?

## 2017-11-27 NOTE — Telephone Encounter (Signed)
Spoke to patient myoview results given.Advised to keep appointment as planned with Kerin Ransom PA 12/05/17 at 10:00 am.

## 2017-12-05 ENCOUNTER — Encounter: Payer: Self-pay | Admitting: Cardiology

## 2017-12-05 ENCOUNTER — Ambulatory Visit: Payer: 59 | Admitting: Cardiology

## 2017-12-05 VITALS — BP 90/60 | HR 54 | Ht 64.0 in | Wt 127.8 lb

## 2017-12-05 DIAGNOSIS — R7989 Other specified abnormal findings of blood chemistry: Secondary | ICD-10-CM | POA: Diagnosis not present

## 2017-12-05 DIAGNOSIS — Z720 Tobacco use: Secondary | ICD-10-CM

## 2017-12-05 DIAGNOSIS — R778 Other specified abnormalities of plasma proteins: Secondary | ICD-10-CM

## 2017-12-05 DIAGNOSIS — Z09 Encounter for follow-up examination after completed treatment for conditions other than malignant neoplasm: Secondary | ICD-10-CM

## 2017-12-05 NOTE — Progress Notes (Signed)
Cardiology Office Note   Date:  12/05/2017   ID:  Donna Todd, DOB 1955/04/13, MRN [id number]  PCP:  Elisabeth Cara, PA-C  Cardiologist: Dr. Domenic Polite, MD-New   Chief Complaint  Patient presents with  . Follow-up   History of Present Illness: Donna Todd is a 62 y.o. female with a hx of tobacco use, depression and adult ADD who presents for hospital follow up today. She was initially seen by our service during a recent hospital admission for abnormal troponin on 11/17/17. She reported that she had been out to eat with friends and she suddenly became nauseated with an episode of emesis. She stated that she never actually had chest pain, SOB or palpitations. She presented to [facility] in which she was diagnosed with potential gastroenteritis and prescribed Zofran. An EKG was performed which showed NSR with nonspecific ST-T wave changes with small R' in lead V1 and V2, prolonged QT interval versus TU fusion per chart review. A troponin was draw which was found to be elevated at 0.12>0.11>0.10 and was therefore transferred to [facility] for further evaluation.   In the ED, chest CT was obtained which showed a right upper lobe 27mm nodule with mild spiculation, but otherwise no other acute findings. Cardiology was consult for further assistance given elevated troponin level. She reported no baseline exertional symptoms. She has no obvious family hx of premature CAD however states that her mother did have stents in her 62's or 86's. Personal cardiac risk factors include tobacco abuse, possible hyperlipidemia which is managed by diet and krill oil. She is on Estrogen patch and uses Ritalin intermittently, as well as Cymbalta per mental health provider. An echocardiogram was completed on 11/18/17 which was normal with an LVEF of 60-65%, NWMA or other abnormal findings. Plans were made to evaluate her with a nuclear stress test on 11/18/17 however it was  reported that she had coffee on the morning of the study and it was therefore cancelled. She was discharged from the hospital on the same day with plans for OP stress and follow up in the office. She therefore underwent a Nuclear Stress on 11/26/17 which was found to be normal, low risk with no ST deviation, T wave inversion and no ischemia noted.   At hospital discharge, she was to follow up with RUL lung nodule with a non-contrast chest CT in 6-12 months.   Today, she reports that she is doing well with no recurrent symptoms. She states that her oral intake has remained relatively low since discharge and her sleep pattern has been off. She is upset over her hospital experience given that she feels that information was not communicated with her very well. I explained to her that the urgent care and ED were trying to figure out why she was having the nausea and vomiting and cardiology was brought into the picture secondary to her elevated troponin and that it was our job to make sure it was not her heart causing her symptoms. We reviewed her echo and stress test results in detail. She denies chest pain, palpitations, LE swelling, diaphoresis, N/V, back pain dizziness, syncope or presyncopal episodes. She continues to smoke and drink alcohol.   Past Medical History:  Diagnosis Date  . Attention deficit disorder   . Depression     Past Surgical History:  Procedure Laterality Date  . ABDOMINAL HYSTERECTOMY      Current Outpatient Medications  Medication Sig Dispense Refill  .  Biotin w/ Vitamins C & E (HAIR/SKIN/NAILS PO) Take 1 tablet by mouth daily.    . DULoxetine (CYMBALTA) 30 MG capsule Take 30 mg by mouth daily.    Marland Kitchen estradiol (VIVELLE-DOT) 0.05 MG/24HR patch Place 1 patch onto the skin 2 (two) times a week.    Marland Kitchen KRILL OIL PO Take 1 tablet by mouth daily.    . ondansetron (ZOFRAN-ODT) 4 MG disintegrating tablet Take 1-2 tablets (4-8 mg total) by mouth every 8 (eight) hours as needed for  nausea. 20 tablet 0  . promethazine (PHENERGAN) 25 MG suppository Place 1 suppository (25 mg total) rectally every 6 (six) hours as needed for nausea or vomiting. 12 each 0  . omeprazole (PRILOSEC) 20 MG capsule Take 20 mg by mouth daily. For 7 days     No current facility-administered medications for this visit.     Allergies:   Sulfa antibiotics   Social History:  The patient  reports that she has been smoking cigarettes. She has a 25.00 pack-year smoking history. She has never used smokeless tobacco. She reports that she drinks about 6.0 standard drinks of alcohol per week. She reports that she has current or past drug history. Drug: Marijuana.   Family History:  The patient's family history includes Diabetes Mellitus II in her mother; Skin cancer in her mother.   ROS:  Please see the history of present illness.  Otherwise, review of systems are positive for none.   All other systems are reviewed and negative.   PHYSICAL EXAM: VS:  BP 90/60   Pulse (!) 54   Ht 5\' 4"  (1.626 m)   Wt 127 lb 12.8 oz (58 kg)   BMI 21.94 kg/m  , BMI Body mass index is 21.94 kg/m.   General: Well developed, well nourished, NAD Skin: Warm, dry, intact  Head: Normocephalic, atraumatic, clear, moist mucus membranes. Neck: Negative for carotid bruits. No JVD Lungs:Clear to ausculation bilaterally. No wheezes, rales, or rhonchi. Breathing is unlabored. Cardiovascular: RRR with S1 S2. No murmurs, rubs, gallops, or LV heave appreciated. Abdomen: Soft, non-tender, non-distended with normoactive bowel sounds. No hepatomegaly, No rebound/guarding. No obvious abdominal masses. MSK: Strength and tone appear normal for age. 5/5 in all extremities Extremities: No edema. No clubbing or cyanosis. DP/PT pulses 2+ bilaterally Neuro: Alert and oriented. No focal deficits. No facial asymmetry. MAE spontaneously. Psych: Responds to questions appropriately with normal affect.    EKG:  EKG is not ordered today.  Recent  Labs: 11/17/2017: ALT 16; BUN 15; Creatinine, Ser 0.86; Potassium 3.6; Sodium 141 11/18/2017: Hemoglobin 12.5; Platelets 260   Lipid Panel No results found for: CHOL, TRIG, HDL, CHOLHDL, VLDL, LDLCALC, LDLDIRECT    Wt Readings from Last 3 Encounters:  12/05/17 127 lb 12.8 oz (58 kg)  11/26/17 124 lb (56.2 kg)  11/17/17 124 lb (56.2 kg)     Other studies Reviewed: Additional studies/ records that were reviewed today include:   Myoview Stress Test 11/26/17:  The left ventricular ejection fraction is normal (55-65%).  Nuclear stress EF: 61%.  There was no ST segment deviation noted during stress.  No T wave inversion was noted during stress.  The study is normal.  This is a low risk study.   Low risk stress nuclear study with normal perfusion and normal left ventricular regional and global systolic function.  Echocardiogram 11/18/17: Study Conclusions  - Left ventricle: The cavity size was normal. Wall thickness was   normal. Systolic function was normal. The estimated ejection  fraction was in the range of 60% to 65%. Wall motion was normal;   there were no regional wall motion abnormalities. Left   ventricular diastolic function parameters were normal.   ASSESSMENT AND PLAN:  1. Elevated troponin on recent hospital admission: -Normal echocardiogram on 11/18/17 and normal Myoview stress test on 11/26/17 -No recurrent symptoms>>likely in the setting of gastritis or food poisoning -Continue current medication regimen, no indication for changes at this time -I have recommended that she keep her follow up appointment already scheduled with her PCP on Monday to discuss further concerns -We will see her back on an as needed basis given her absence of symptoms and negative cardiac studies.   2. Hypotension: -Asymptomatic with an initial BP of 90/60 and repeat BP of 98/60 -I have encouraged her to increase her fluid and oral intake and try to regulate her sleep  patterns -She will have this rechecked by her PCP on Monday  -I have also encouraged her to spot check this with her fathers BP [facility] -She is on no antihypertensive and is likely in the setting of mild dehydration and low oral intake   3. 51mm spiculated right upper lobe lung nodule: -Found on chest CT with recommendations of close follow up with non contrast chest CT in 6-12 months per primary care team concerning given hx of tobacco use -Following with PCP on Monday   3. Tobacco abuse: -Smoking cessation strongly encouraged  -Dose not appear to be ready for cessation    Current medicines are reviewed at length with the patient today.  The patient does not have concerns regarding medicines.  The following changes have been made:  no change  Labs/ tests ordered today include: None  No orders of the defined types were placed in this encounter.   Disposition:   FU on an as needed basis  Signed,  Kathyrn Drown, NP  12/05/2017 10:33 AM    [facility] [address], [place], [place]  [address] Phone: [phone]; Fax: [phone]

## 2017-12-05 NOTE — Patient Instructions (Signed)
Medication Instructions:  Your physician recommends that you continue on your current medications as directed. Please refer to the Current Medication list given to you today.  If you need a refill on your cardiac medications before your next appointment, please call your pharmacy.  Labwork: None  [employer] in our office  Testing/Procedures: None   Follow-Up: Follow up on a as needed basis  Any Other Special Instructions Will Be Listed Below (If Applicable).

## 2020-02-08 ENCOUNTER — Other Ambulatory Visit: Payer: Self-pay | Admitting: Internal Medicine

## 2020-02-08 DIAGNOSIS — Z122 Encounter for screening for malignant neoplasm of respiratory organs: Secondary | ICD-10-CM

## 2020-05-03 IMAGING — CT CT ANGIO CHEST
2 of 8 series · 19 of 46 positions shown · IV contrast (iopamidol)
Comparison: None.

CLINICAL DATA: Nausea and vomiting for several days

EXAM:
CT ANGIOGRAPHY CHEST WITH CONTRAST
TECHNIQUE: Multidetector CT imaging of the chest was performed using the
standard protocol during bolus administration of intravenous
contrast. Multiplanar CT image reconstructions and MIPs were
obtained to evaluate the vascular anatomy.
CONTRAST:  100mL 97GCMY-UF6 IOPAMIDOL (97GCMY-UF6) INJECTION 76%

[Series 6: thins · axial · 0.78mm/px · z∈[+1192,+1446]mm · 16 of 280 slices shown]
[im 13/280  lung]
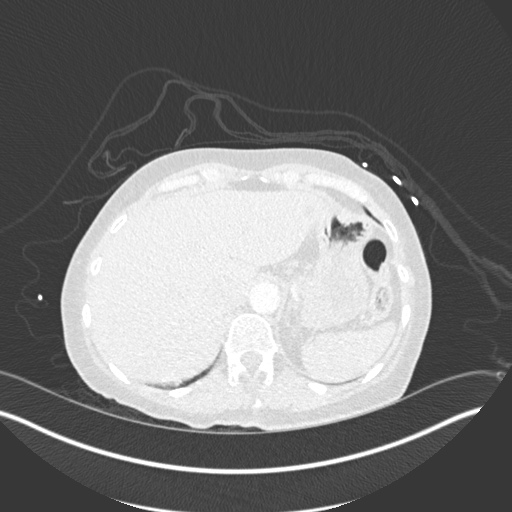
[im 26/280  soft-tissue]
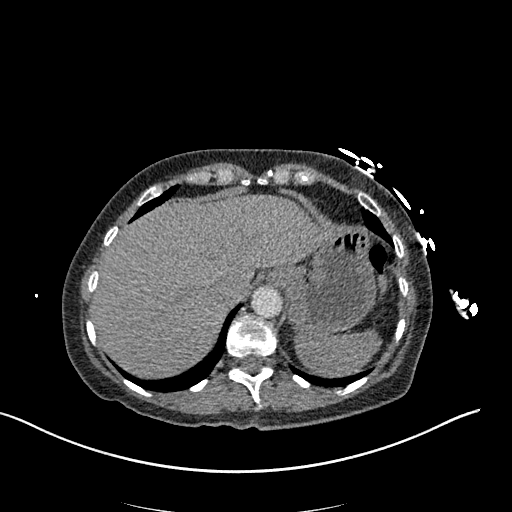
[im 51/280  lung]
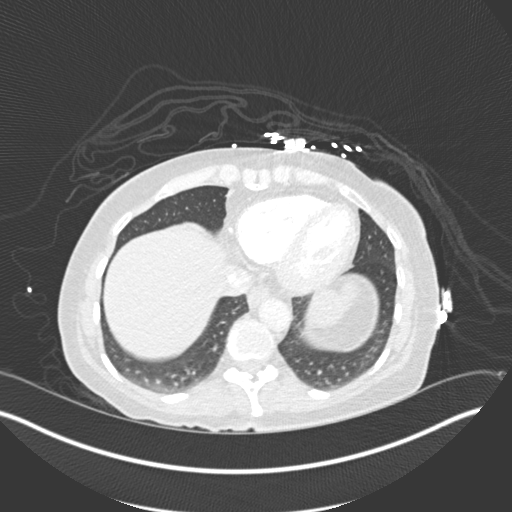
[im 64/280  soft-tissue]
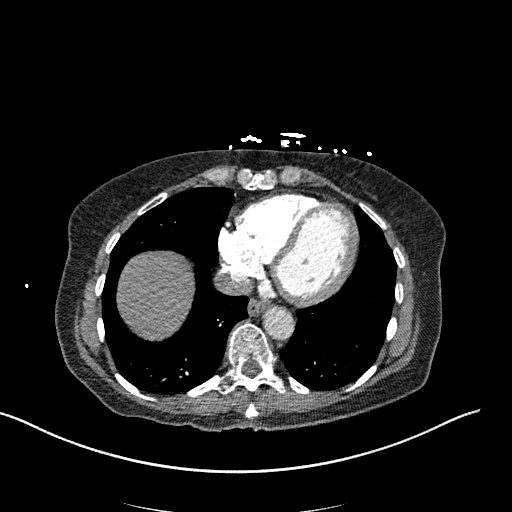
[im 77/280  lung]
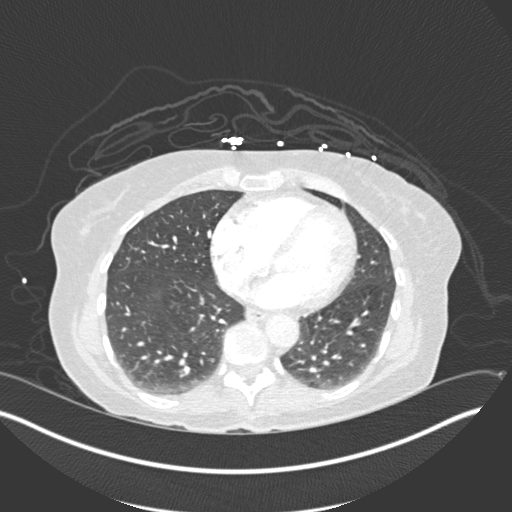
[im 102/280  soft-tissue]
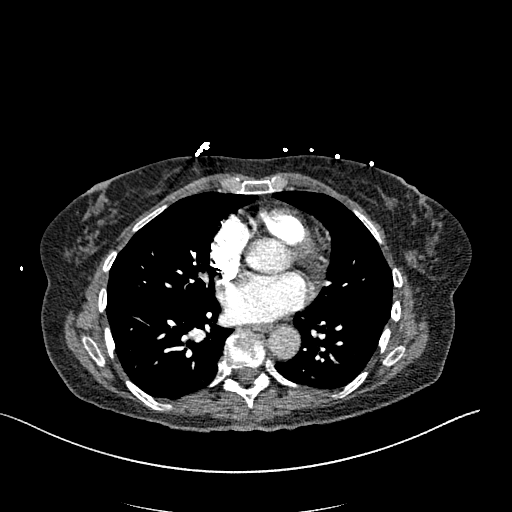
[im 115/280  lung]
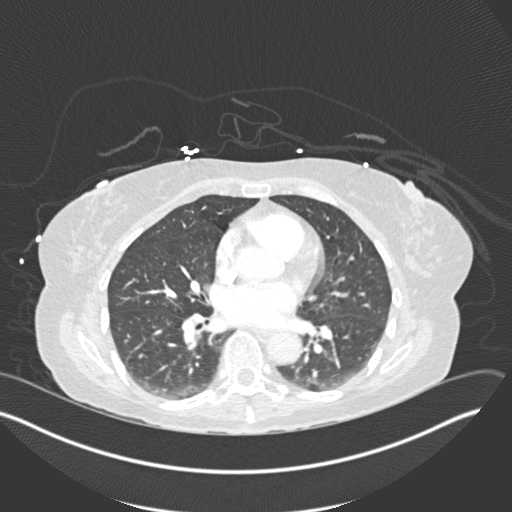
[im 127/280  soft-tissue]
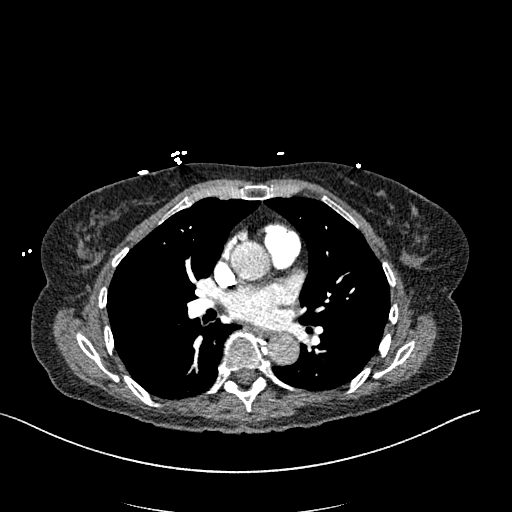
[im 153/280  lung]
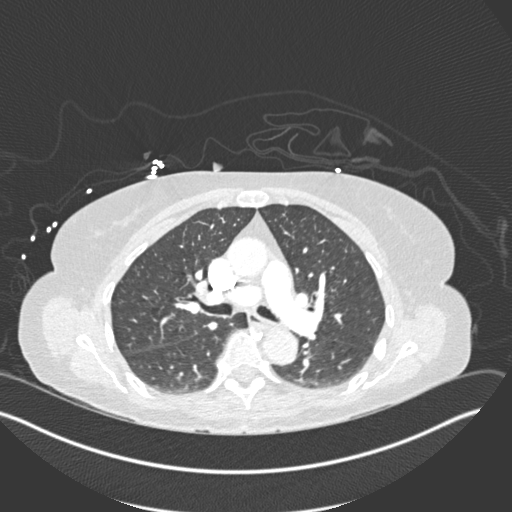
[im 165/280  soft-tissue]
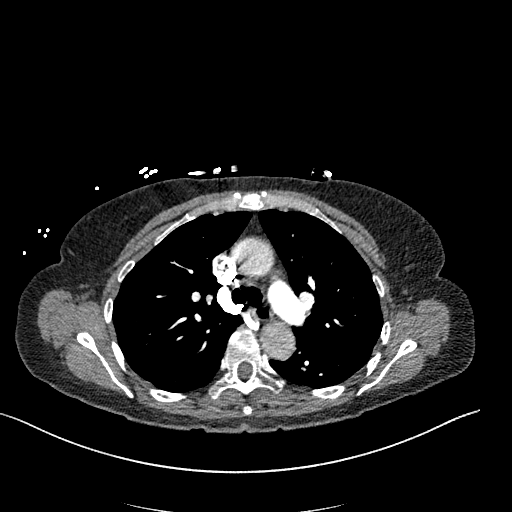
[im 178/280  lung]
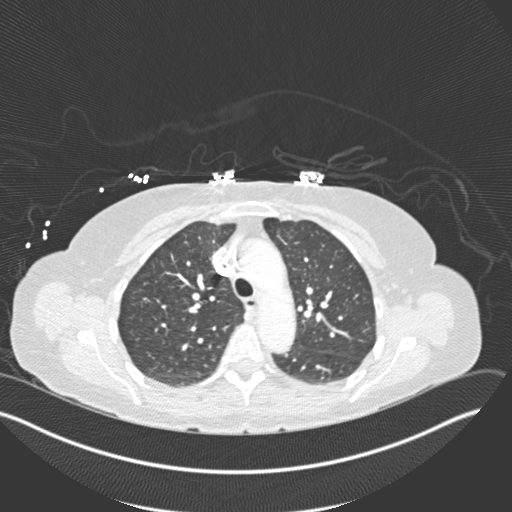
[im 203/280  soft-tissue]
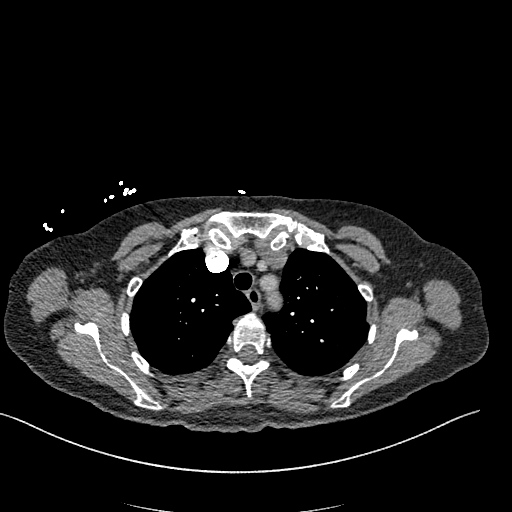
[im 216/280  lung]
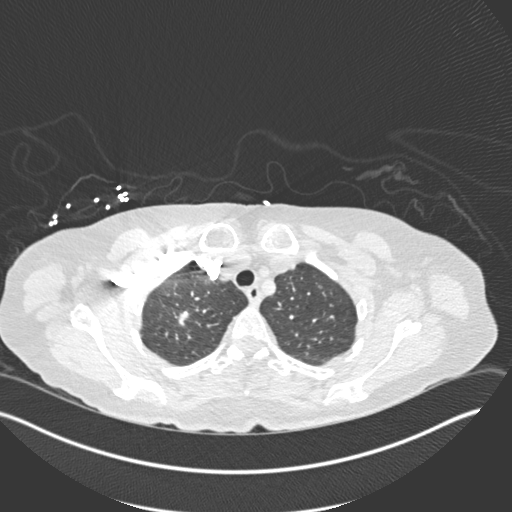
[im 229/280  soft-tissue]
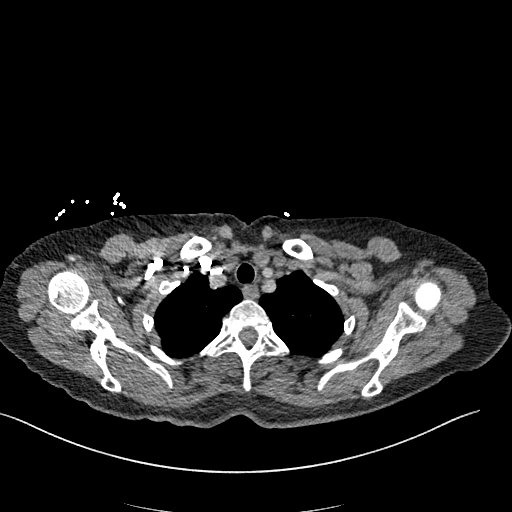
[im 254/280  lung]
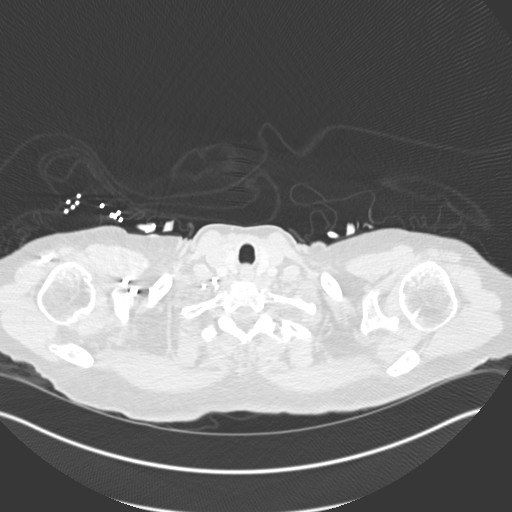
[im 267/280  soft-tissue]
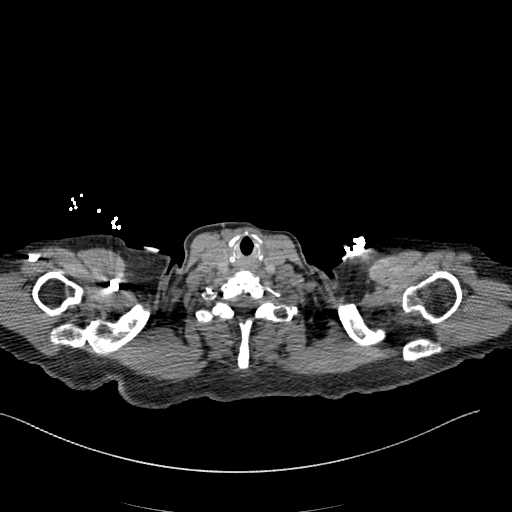

[Series 8: coronal mpr · coronal · 0.59mm/px · 3 of 122 slices shown]
[im 31/122  soft-tissue]
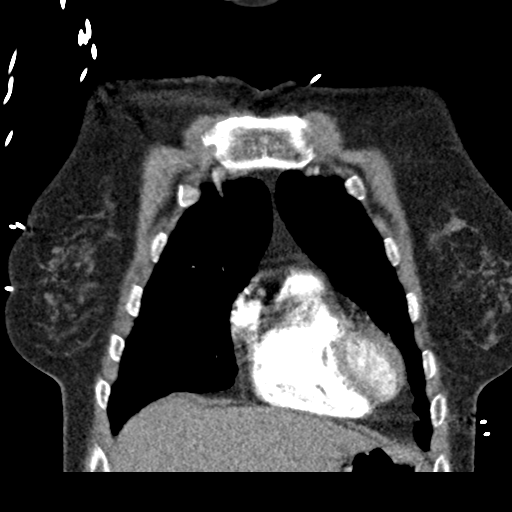
[im 61/122  soft-tissue]
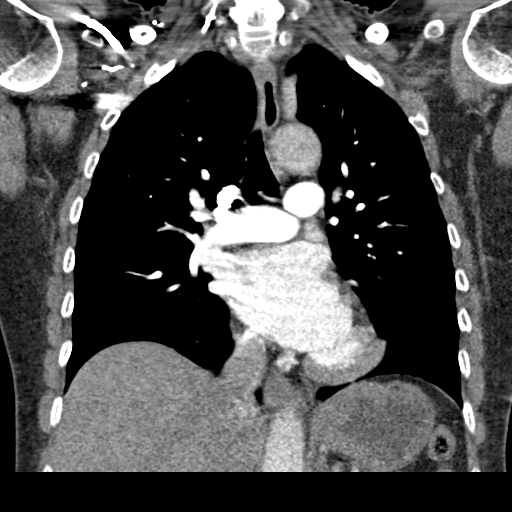
[im 91/122  soft-tissue]
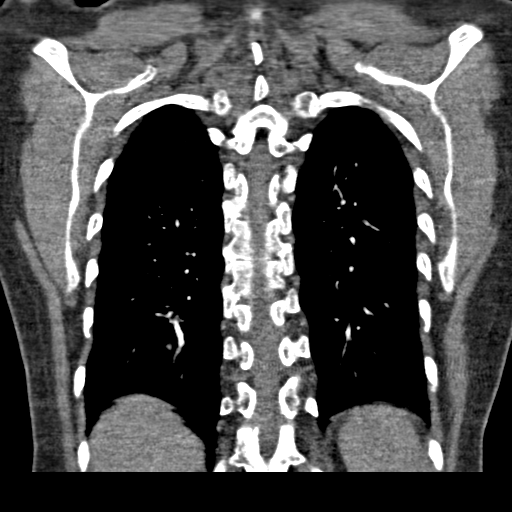

[19 of 46 positions shown; findings below may reference images not displayed]

FINDINGS: Cardiovascular: Thoracic aorta is within normal limits. No
atherosclerotic calcifications are seen. No cardiac enlargement is
noted. The pulmonary artery shows a normal branching pattern. No
filling defects are identified to suggest pulmonary emboli.

Mediastinum/Nodes: Thoracic inlet is within normal limits. No hilar
or mediastinal adenopathy is noted. The esophagus is within normal
limits.

Lungs/Pleura: Lungs are well aerated bilaterally. In the right upper
lobe there is a 8 mm mean diameter nodule with mild spiculation
identified. No other nodular changes are noted. No focal infiltrate
or sizable effusion is seen.

Upper Abdomen: Visualized upper abdomen is within normal limits.

Musculoskeletal: Bony structures are within normal limits.

Review of the MIP images confirms the above findings.
IMPRESSION: 8 mm mildly spiculated nodule in the right upper lobe as described.
Non-contrast chest CT at 6-12 months is recommended. If the nodule
is stable at time of repeat CT, then future CT at 18-24 months (from
today's scan) is considered optional for low-risk patients, but is
recommended for high-risk patients. This recommendation follows the
consensus statement: Guidelines for Management of Incidental
Pulmonary Nodules Detected on CT Images: From the [HOSPITAL]

## 2020-12-21 DIAGNOSIS — J449 Chronic obstructive pulmonary disease, unspecified: Secondary | ICD-10-CM | POA: Insufficient documentation

## 2021-02-10 DIAGNOSIS — C3490 Malignant neoplasm of unspecified part of unspecified bronchus or lung: Secondary | ICD-10-CM | POA: Insufficient documentation

## 2021-02-10 DIAGNOSIS — M1990 Unspecified osteoarthritis, unspecified site: Secondary | ICD-10-CM | POA: Insufficient documentation

## 2021-02-10 DIAGNOSIS — E785 Hyperlipidemia, unspecified: Secondary | ICD-10-CM | POA: Insufficient documentation

## 2021-02-15 ENCOUNTER — Telehealth: Payer: Self-pay | Admitting: Medical Oncology

## 2021-02-15 NOTE — Telephone Encounter (Signed)
New appt requested.-  Pts daughter, Claiborne Billings, is requesting new pt appt with [facility] ASAP before her scheduled RUL lobectomy on 12/29 at [facility]. Pt has not had biopsy.  She is not sure her mom will agree to surgery .  CT and PET scan done at [facility] and are in Care everywhere.  On Monday , dtr's PCP  made referral to Dr. Julien Nordmann.

## 2021-02-16 ENCOUNTER — Inpatient Hospital Stay (HOSPITAL_BASED_OUTPATIENT_CLINIC_OR_DEPARTMENT_OTHER): Payer: Medicare Other | Admitting: Internal Medicine

## 2021-02-16 ENCOUNTER — Encounter: Payer: Self-pay | Admitting: *Deleted

## 2021-02-16 ENCOUNTER — Encounter: Payer: Self-pay | Admitting: Internal Medicine

## 2021-02-16 ENCOUNTER — Inpatient Hospital Stay: Payer: Medicare Other | Attending: Internal Medicine

## 2021-02-16 ENCOUNTER — Other Ambulatory Visit: Payer: Self-pay

## 2021-02-16 VITALS — BP 125/73 | HR 54 | Temp 97.9°F | Resp 17 | Ht 64.0 in | Wt 120.4 lb

## 2021-02-16 DIAGNOSIS — F17210 Nicotine dependence, cigarettes, uncomplicated: Secondary | ICD-10-CM

## 2021-02-16 DIAGNOSIS — R911 Solitary pulmonary nodule: Secondary | ICD-10-CM | POA: Insufficient documentation

## 2021-02-16 DIAGNOSIS — J984 Other disorders of lung: Secondary | ICD-10-CM

## 2021-02-16 DIAGNOSIS — Z808 Family history of malignant neoplasm of other organs or systems: Secondary | ICD-10-CM

## 2021-02-16 DIAGNOSIS — R079 Chest pain, unspecified: Secondary | ICD-10-CM

## 2021-02-16 DIAGNOSIS — R918 Other nonspecific abnormal finding of lung field: Secondary | ICD-10-CM | POA: Insufficient documentation

## 2021-02-16 DIAGNOSIS — M79601 Pain in right arm: Secondary | ICD-10-CM

## 2021-02-16 LAB — CBC WITH DIFFERENTIAL (CANCER CENTER ONLY)
Abs Immature Granulocytes: 0.01 10*3/uL (ref 0.00–0.07)
Basophils Absolute: 0.1 10*3/uL (ref 0.0–0.1)
Basophils Relative: 1 %
Eosinophils Absolute: 0.1 10*3/uL (ref 0.0–0.5)
Eosinophils Relative: 2 %
HCT: 34.1 % — ABNORMAL LOW (ref 36.0–46.0)
Hemoglobin: 11.6 g/dL — ABNORMAL LOW (ref 12.0–15.0)
Immature Granulocytes: 0 %
Lymphocytes Relative: 31 %
Lymphs Abs: 1.6 10*3/uL (ref 0.7–4.0)
MCH: 30.6 pg (ref 26.0–34.0)
MCHC: 34 g/dL (ref 30.0–36.0)
MCV: 90 fL (ref 80.0–100.0)
Monocytes Absolute: 0.6 10*3/uL (ref 0.1–1.0)
Monocytes Relative: 10 %
Neutro Abs: 3 10*3/uL (ref 1.7–7.7)
Neutrophils Relative %: 56 %
Platelet Count: 226 10*3/uL (ref 150–400)
RBC: 3.79 MIL/uL — ABNORMAL LOW (ref 3.87–5.11)
RDW: 13.7 % (ref 11.5–15.5)
WBC Count: 5.3 10*3/uL (ref 4.0–10.5)
nRBC: 0 % (ref 0.0–0.2)

## 2021-02-16 LAB — CMP (CANCER CENTER ONLY)
ALT: 12 U/L (ref 0–44)
AST: 18 U/L (ref 15–41)
Albumin: 4 g/dL (ref 3.5–5.0)
Alkaline Phosphatase: 46 U/L (ref 38–126)
Anion gap: 8 (ref 5–15)
BUN: 16 mg/dL (ref 8–23)
CO2: 24 mmol/L (ref 22–32)
Calcium: 8.6 mg/dL — ABNORMAL LOW (ref 8.9–10.3)
Chloride: 108 mmol/L (ref 98–111)
Creatinine: 0.86 mg/dL (ref 0.44–1.00)
GFR, Estimated: >60 mL/min (ref >60)
Glucose, Bld: 95 mg/dL (ref 70–99)
Potassium: 4.3 mmol/L (ref 3.5–5.1)
Sodium: 140 mmol/L (ref 135–145)
Total Bilirubin: 0.4 mg/dL (ref 0.3–1.2)
Total Protein: 6.4 g/dL — ABNORMAL LOW (ref 6.5–8.1)

## 2021-02-16 NOTE — Progress Notes (Signed)
Oncology Nurse Navigator Documentation  Oncology Nurse Navigator Flowsheets 02/16/2021  Navigator Location [facility]  Referral Date to RadOnc/MedOnc 02/16/2021  Navigator Encounter Type Telephone  Telephone Outgoing Call/I received a message from Dr. Julien Nordmann that patient would like to see him asap. I called and spoke to daughter. I gave her an appt for today.  She verbalized understanding of appt time and place.  Multidisiplinary Clinic Date 02/16/2021  Multidisiplinary Clinic Type Thoracic  Treatment Phase Pre-Tx/Tx Discussion  Barriers/Navigation Needs Coordination of Care;Education  Education Other  Interventions Coordination of Care;Education  Acuity Level 2-Minimal Needs (1-2 Barriers Identified)  Coordination of Care Appts  Education Method Verbal  Time Spent with Patient 25

## 2021-02-16 NOTE — Progress Notes (Signed)
[place] [phone]   [phone]  CONSULT NOTE  REFERRING PHYSICIAN: Everardo Beals, NP  REASON FOR CONSULTATION:  65 years old white female with suspicious lung cancer.  HPI Donna Todd is a 64 y.o. female with past medical history significant for attention deficit disorder and depression as well as history of smoking.  The patient had CT screening on 11/17/2017 and that showed 0.8 cm right upper lobe nodule.  For insurance issues she did not follow-up for further evaluation of this lesion.  When the patient had Medicare at age 24 she was seen by her primary care physician and repeat CT scan of the chest was performed on December 05, 2020 and that showed increase in the size of the right upper lobe nodule measuring 2.0 cm.  This was followed by a PET scan at [facility] and that showed a hypermetabolic right upper lobe nodule measuring 1.9 x 1.8 cm with SUV max of 8.92.  There was no evidence for metastatic disease to the mediastinal lymph nodes or distant metastatic disease. The patient was seen by cardiothoracic surgery at [facility], Dr. Druscilla Brownie and she is scheduled for surgical resection on March 02, 2021.  The patient wanted to speak to a medical oncology before her surgery for any additional recommendation or opinions.  She came today for reevaluation and discussion of her treatment options. When seen today she is feeling fine except for foggy head she is currently on pain medication with hydrocodone by her primary care physician for generalized aching pain especially on the right arm and right chest.  She also has an anxiety.  She denied having any significant shortness of breath, cough or hemoptysis.  She has some gastrointestinal trouble with intermittent nausea as well as diarrhea and constipation.  She lost around 15-20 pounds in the last few months because of lack of appetite. She has no headache or visual  changes. Family history significant for mother with skin cancer and diabetes.  Father had tongue cancer and currently alive.  She also has a sister with esophageal cancer. The patient is single and has 1 daughter, Claiborne Billings who accompanied her to the visit today.  She used to do office work.  She has a history of smoking up to 2 pack/day for around 50 years and unfortunately she continues to smoke.  She drinks alcohol at regular basis.  She also chew and eat marijuana.  HPI  Past Medical History:  Diagnosis Date   Attention deficit disorder    Depression     Past Surgical History:  Procedure Laterality Date   ABDOMINAL HYSTERECTOMY      Family History  Problem Relation Age of Onset   Skin cancer Mother    Diabetes Mellitus II Mother     Social History Social History   Tobacco Use   Smoking status: Some Days    Packs/day: 1.00    Years: 25.00    Pack years: 25.00    Types: Cigarettes   Smokeless tobacco: Never   Tobacco comments:    Pt reports she smokes off and on   Vaping Use   Vaping Use: Never used  Substance Use Topics   Alcohol use: Yes    Alcohol/week: 6.0 standard drinks    Types: 6 Cans of beer per week   Drug use: Yes    Types: Marijuana    Allergies  Allergen Reactions   Sulfa Antibiotics Other (See Comments)    MOUTH  ULCERS    Current Outpatient Medications  Medication Sig Dispense Refill   Biotin w/ Vitamins C & E (HAIR/SKIN/NAILS PO) Take 1 tablet by mouth daily.     DULoxetine (CYMBALTA) 30 MG capsule Take 30 mg by mouth daily.     estradiol (VIVELLE-DOT) 0.05 MG/24HR patch Place 1 patch onto the skin 2 (two) times a week.     KRILL OIL PO Take 1 tablet by mouth daily.     omeprazole (PRILOSEC) 20 MG capsule Take 20 mg by mouth daily. For 7 days     ondansetron (ZOFRAN-ODT) 4 MG disintegrating tablet Take 1-2 tablets (4-8 mg total) by mouth every 8 (eight) hours as needed for nausea. 20 tablet 0   promethazine (PHENERGAN) 25 MG suppository Place 1  suppository (25 mg total) rectally every 6 (six) hours as needed for nausea or vomiting. 12 each 0   No current facility-administered medications for this visit.    Review of Systems  Constitutional: positive for fatigue and weight loss Eyes: negative Ears, nose, mouth, throat, and face: negative Respiratory: positive for pleurisy/chest pain Cardiovascular: negative Gastrointestinal: positive for constipation and nausea Genitourinary:negative Integument/breast: negative Hematologic/lymphatic: negative Musculoskeletal:negative Neurological: negative Behavioral/Psych: negative Endocrine: negative Allergic/Immunologic: negative  Physical Exam  DTO:IZTIW, healthy, no distress, well nourished, well developed, and anxious SKIN: skin color, texture, turgor are normal, no rashes or significant lesions HEAD: Normocephalic, No masses, lesions, tenderness or abnormalities EYES: normal, PERRLA, Conjunctiva are pink and non-injected EARS: External ears normal, Canals clear OROPHARYNX:no exudate, no erythema, and lips, buccal mucosa, and tongue normal  NECK: supple, no adenopathy, no JVD LYMPH:  no palpable lymphadenopathy, no hepatosplenomegaly BREAST:not examined LUNGS: clear to auscultation , and palpation HEART: regular rate & rhythm, no murmurs, and no gallops ABDOMEN:abdomen soft, non-tender, normal bowel sounds, and no masses or organomegaly BACK: Back symmetric, no curvature., No CVA tenderness EXTREMITIES:no joint deformities, effusion, or inflammation, no edema  NEURO: alert & oriented x 3 with fluent speech, no focal motor/sensory deficits  PERFORMANCE STATUS: ECOG 1  LABORATORY DATA: Lab Results  Component Value Date   WBC 10.0 11/18/2017   HGB 12.5 11/18/2017   HCT 37.9 11/18/2017   MCV 92.7 11/18/2017   PLT 260 11/18/2017      Chemistry      Component Value Date/Time   NA 141 11/17/2017 1243   K 3.6 11/17/2017 1243   CL 108 11/17/2017 1243   CO2 24 11/17/2017  1243   BUN 15 11/17/2017 1243   CREATININE 0.86 11/17/2017 1243      Component Value Date/Time   CALCIUM 9.2 11/17/2017 1243   ALKPHOS 49 11/17/2017 1243   AST 24 11/17/2017 1243   ALT 16 11/17/2017 1243   BILITOT 0.9 11/17/2017 1243       RADIOGRAPHIC STUDIES: No results found.  ASSESSMENT: This is a very pleasant 65 years old white female with likely stage IA (T1b, N0, M0) lung cancer likely non-small cell pending tissue diagnosis and presented with right upper lobe lung nodule.   PLAN: I had a lengthy discussion with the patient and her daughter today about her current disease stage and possible treatment options. I personally and independently reviewed the previous scan images and showed it to the patient and her daughter. I strongly recommend for the patient to proceed with the surgical resection as recommended by Dr. Druscilla Brownie at [facility] as originally planned on March 02, 2021. I explained to the patient that I would like to see her  after the surgical resection for further recommendation regarding any additional adjuvant treatment or just monitor if needed based on the final pathology. For smoking cessation I strongly encouraged the patient to quit smoking. For pain management she is currently on hydrocodone by her primary care physician and there is no clear evidence that her right upper lobe lung nodule is the source of her pain and this will not be managed by oncology.  She may need to be referred to a pain clinic if she has chronic pain issues. The patient was advised to call immediately if she has any other concerning issues in the interval. The patient voices understanding of current disease status and treatment options and is in agreement with the current care plan.  All questions were answered. The patient knows to call the clinic with any problems, questions or concerns. We can certainly see the patient much sooner if necessary.  Thank you so much for  allowing me to participate in the care of [facility]. I will continue to follow up the patient with you and assist in her care. The total time spent in the appointment was 60 minutes.  Disclaimer: This note was dictated with voice recognition software. Similar sounding words can inadvertently be transcribed and may not be corrected upon review.   Eilleen Kempf February 16, 2021, 2:17 PM

## 2021-02-16 NOTE — Progress Notes (Signed)
Oncology Nurse Navigator Documentation  Oncology Nurse Navigator Flowsheets 02/16/2021 02/16/2021  Navigator Follow Up Date: 02/28/2021 -  Navigator Follow Up Reason: Surgery -  Navigator Location [facility]  Referral Date to RadOnc/MedOnc - 02/16/2021  Navigator Encounter Type Clinic/MDC;Initial MedOnc Telephone  Telephone - Outgoing Call  [place] [facility] Date - 02/16/2021  Multidisiplinary Clinic Type - Thoracic  Patient Visit Type Initial;MedOnc -  Treatment Phase Abnormal Scans/patient had dx of lung mass which is hypermetabolic on PET scan but no tissue dx. Patient is set up for surgery at [facility] in 2 weeks and will be getting surgery for primary tx.  She is to have follow up in 1 month with Dr. Julien Nordmann, he completed scheduling message.  Pre-Tx/Tx Discussion  Barriers/Navigation Needs Education Coordination of Care;Education  Education Newly Diagnosed Cancer Education;Other Other  Interventions Education;Psycho-Social Support Coordination of Care;Education  Acuity Level 3-Moderate Needs (3-4 Barriers Identified) Level 2-Minimal Needs (1-2 Barriers Identified)  Coordination of Care Other Appts  Education Method Verbal;Written Verbal  Time Spent with Patient 25 49

## 2021-02-23 ENCOUNTER — Telehealth: Payer: Self-pay | Admitting: Internal Medicine

## 2021-02-23 NOTE — Telephone Encounter (Signed)
Sch per 12/15 los, pt aware.

## 2021-04-05 ENCOUNTER — Inpatient Hospital Stay: Payer: Medicare Other | Admitting: Internal Medicine

## 2021-04-05 ENCOUNTER — Other Ambulatory Visit: Payer: Self-pay

## 2021-04-05 ENCOUNTER — Inpatient Hospital Stay: Payer: Medicare Other | Attending: Internal Medicine

## 2021-04-05 ENCOUNTER — Encounter: Payer: Self-pay | Admitting: Internal Medicine

## 2021-04-05 VITALS — BP 143/90 | HR 83 | Temp 97.8°F | Resp 17 | Ht 64.0 in | Wt 114.8 lb

## 2021-04-05 DIAGNOSIS — R918 Other nonspecific abnormal finding of lung field: Secondary | ICD-10-CM

## 2021-04-05 DIAGNOSIS — Z902 Acquired absence of lung [part of]: Secondary | ICD-10-CM | POA: Diagnosis not present

## 2021-04-05 DIAGNOSIS — C3490 Malignant neoplasm of unspecified part of unspecified bronchus or lung: Secondary | ICD-10-CM

## 2021-04-05 DIAGNOSIS — C3411 Malignant neoplasm of upper lobe, right bronchus or lung: Secondary | ICD-10-CM | POA: Insufficient documentation

## 2021-04-05 LAB — CMP (CANCER CENTER ONLY)
ALT: 11 U/L (ref 0–44)
AST: 17 U/L (ref 15–41)
Albumin: 4.1 g/dL (ref 3.5–5.0)
Alkaline Phosphatase: 55 U/L (ref 38–126)
Anion gap: 8 (ref 5–15)
BUN: 13 mg/dL (ref 8–23)
CO2: 26 mmol/L (ref 22–32)
Calcium: 9.5 mg/dL (ref 8.9–10.3)
Chloride: 106 mmol/L (ref 98–111)
Creatinine: 0.95 mg/dL (ref 0.44–1.00)
GFR, Estimated: >60 mL/min (ref >60)
Glucose, Bld: 106 mg/dL — ABNORMAL HIGH (ref 70–99)
Potassium: 4.2 mmol/L (ref 3.5–5.1)
Sodium: 140 mmol/L (ref 135–145)
Total Bilirubin: 0.3 mg/dL (ref 0.3–1.2)
Total Protein: 7.2 g/dL (ref 6.5–8.1)

## 2021-04-05 LAB — CBC WITH DIFFERENTIAL (CANCER CENTER ONLY)
Abs Immature Granulocytes: 0.02 10*3/uL (ref 0.00–0.07)
Basophils Absolute: 0.1 10*3/uL (ref 0.0–0.1)
Basophils Relative: 1 %
Eosinophils Absolute: 0.5 10*3/uL (ref 0.0–0.5)
Eosinophils Relative: 8 %
HCT: 33.5 % — ABNORMAL LOW (ref 36.0–46.0)
Hemoglobin: 11.7 g/dL — ABNORMAL LOW (ref 12.0–15.0)
Immature Granulocytes: 0 %
Lymphocytes Relative: 25 %
Lymphs Abs: 1.8 10*3/uL (ref 0.7–4.0)
MCH: 31.7 pg (ref 26.0–34.0)
MCHC: 34.9 g/dL (ref 30.0–36.0)
MCV: 90.8 fL (ref 80.0–100.0)
Monocytes Absolute: 0.6 10*3/uL (ref 0.1–1.0)
Monocytes Relative: 9 %
Neutro Abs: 3.9 10*3/uL (ref 1.7–7.7)
Neutrophils Relative %: 57 %
Platelet Count: 435 10*3/uL — ABNORMAL HIGH (ref 150–400)
RBC: 3.69 MIL/uL — ABNORMAL LOW (ref 3.87–5.11)
RDW: 14.3 % (ref 11.5–15.5)
WBC Count: 6.9 10*3/uL (ref 4.0–10.5)
nRBC: 0 % (ref 0.0–0.2)

## 2021-04-05 NOTE — Progress Notes (Signed)
[place] [phone]   [phone]  OFFICE PROGRESS NOTE  Donna Todd [address] [place] [address]  DIAGNOSIS: stage IA (T1c, N0, M0) non-small cell lung cancer, invasive poorly differentiated adenocarcinoma, 65% acinar, 20% solid and 10% lipidic, 5% cribriform presented with right upper lobe lung nodule diagnosed in November 2022 .  PRIOR THERAPY: Status post right upper lobectomy with lymph node dissection under the care of Dr. Druscilla Brownie at [facility] on March 23, 2021.  The tumor size was 2.3 cm with negative visceral pleural or lymphovascular invasion.  She has positive EGFR mutation in exon 21 ([id number])  CURRENT THERAPY: Observation.  INTERVAL HISTORY: Donna Todd 66 y.o. female returns to the clinic today for follow-up visit.  The patient is feeling fine today with no concerning complaints except for the right-sided chest pain from the surgical scar.  She is currently on pain medication by her thoracic surgeon.  She is scheduled to see him later this week.  She denied having any shortness of breath, cough or hemoptysis.  She denied having any fever or chills she has no nausea, vomiting, diarrhea or constipation.  She has no significant weight loss or night sweats.  She has no headache or visual changes.  She tolerated her surgery fairly well except for the pain from the surgical scar.  She is here today for evaluation and discussion of her condition and any future treatment options.  MEDICAL HISTORY: Past Medical History:  Diagnosis Date   Attention deficit disorder    Depression     ALLERGIES:  is allergic to sulfa antibiotics.  MEDICATIONS:  Current Outpatient Medications  Medication Sig Dispense Refill   Biotin w/ Vitamins C & E (HAIR/SKIN/NAILS PO) Take 1 tablet by mouth daily.     cyanocobalamin 1000 MCG tablet Take 1 tablet by mouth daily.     DULoxetine (CYMBALTA) 60 MG capsule  Take 60 mg by mouth daily.     HYDROcodone-acetaminophen (NORCO/VICODIN) 5-325 MG tablet Take 1-2 tablets by mouth every 6 (six) hours as needed.     KRILL OIL PO Take 1 tablet by mouth daily.     naproxen (NAPROSYN) 500 MG tablet Take 500 mg by mouth 2 (two) times daily.     omeprazole (PRILOSEC) 20 MG capsule Take 20 mg by mouth daily. For 7 days     oxyCODONE (OXY IR/ROXICODONE) 5 MG immediate release tablet Take 5 mg by mouth 4 (four) times daily as needed.     traZODone (DESYREL) 50 MG tablet Take 25-50 mg by mouth at bedtime as needed.     valACYclovir (VALTREX) 500 MG tablet Take 500 mg by mouth daily.     varenicline (CHANTIX) 1 MG tablet Take 1 mg by mouth 2 (two) times daily.     No current facility-administered medications for this visit.    SURGICAL HISTORY:  Past Surgical History:  Procedure Laterality Date   ABDOMINAL HYSTERECTOMY      REVIEW OF SYSTEMS:  Constitutional: positive for fatigue Eyes: negative Ears, nose, mouth, throat, and face: negative Respiratory: positive for pleurisy/chest pain Cardiovascular: negative Gastrointestinal: negative Genitourinary:negative Integument/breast: negative Hematologic/lymphatic: negative Musculoskeletal:negative Neurological: negative Behavioral/Psych: negative Endocrine: negative Allergic/Immunologic: negative   PHYSICAL EXAMINATION: General appearance: alert, cooperative, appears stated age, and no distress Head: Normocephalic, without obvious abnormality, atraumatic Neck: no adenopathy, no JVD, supple, symmetrical, trachea midline, and thyroid not enlarged, symmetric, no tenderness/mass/nodules Lymph nodes: Cervical, supraclavicular,  and axillary nodes normal. Resp: clear to auscultation bilaterally Back: symmetric, no curvature. ROM normal. No CVA tenderness. Cardio: regular rate and rhythm, S1, S2 normal, no murmur, click, rub or gallop GI: soft, non-tender; bowel sounds normal; no masses,  no  organomegaly Extremities: extremities normal, atraumatic, no cyanosis or edema Neurologic: Alert and oriented X 3, normal strength and tone. Normal symmetric reflexes. Normal coordination and gait  ECOG PERFORMANCE STATUS: 1 - Symptomatic but completely ambulatory  Blood pressure (!) 143/90, pulse 83, temperature 97.8 F (36.6 C), temperature source Tympanic, resp. rate 17, height $RemoveBe'5\' 4"'SvDCZzfFV$  (1.626 m), weight 114 lb 12.8 oz (52.1 kg), SpO2 97 %.  LABORATORY DATA: Lab Results  Component Value Date   WBC 6.9 04/05/2021   HGB 11.7 (L) 04/05/2021   HCT 33.5 (L) 04/05/2021   MCV 90.8 04/05/2021   PLT 435 (H) 04/05/2021      Chemistry      Component Value Date/Time   NA 140 02/16/2021 1401   K 4.3 02/16/2021 1401   CL 108 02/16/2021 1401   CO2 24 02/16/2021 1401   BUN 16 02/16/2021 1401   CREATININE 0.86 02/16/2021 1401      Component Value Date/Time   CALCIUM 8.6 (L) 02/16/2021 1401   ALKPHOS 46 02/16/2021 1401   AST 18 02/16/2021 1401   ALT 12 02/16/2021 1401   BILITOT 0.4 02/16/2021 1401       RADIOGRAPHIC STUDIES: No results found.  ASSESSMENT AND PLAN: This is a very pleasant 66 years old white female recently diagnosed with stage IA (T1c, N0, M0) non-small cell lung cancer, invasive poorly differentiated adenocarcinoma diagnosed in November 2022 status post right upper lobectomy with lymph node dissection on March 23, 2021 under the care of Dr. Druscilla Brownie.  The resected tumor measured 2.3 cm with negative lymph node dissection as well as no evidence for visceral pleural or lymphovascular invasion and it was positive for EGFR mutation in exon 21 ([id number]). I had a lengthy discussion with the patient today about her current condition and future treatment options. I explained to the patient that the median 5-year survival for patient with a stage IA is around 80% and no clear evidence for adjuvant systemic chemotherapy or targeted therapy for the patient with a stage Ia at this  point. I explained to the patient that the current standard of care is observation and close monitoring. I recommended for her to come back for follow-up visit with repeat CT scan of the chest in 6 months. The patient will continue with her current pain medication for now for the right-sided surgical pain. She was advised to call immediately if she has any other concerning symptoms in the interval. The patient voices understanding of current disease status and treatment options and is in agreement with the current care plan.  All questions were answered. The patient knows to call the clinic with any problems, questions or concerns. We can certainly see the patient much sooner if necessary.  The total time spent in the appointment was 30 minutes.  Disclaimer: This note was dictated with voice recognition software. Similar sounding words can inadvertently be transcribed and may not be corrected upon review.

## 2021-04-07 ENCOUNTER — Encounter: Payer: Self-pay | Admitting: *Deleted

## 2021-04-07 NOTE — Progress Notes (Signed)
Oncology Nurse Navigator Documentation  Oncology Nurse Navigator Flowsheets 04/07/2021 02/16/2021 02/16/2021  Abnormal Finding Date 01/03/2021 - -  Confirmed Diagnosis Date 03/23/2021 - -  Diagnosis Status Confirmed Diagnosis Complete - -  Planned Course of Treatment Surgery - -  Phase of Treatment Surgery - -  Surgery Actual Start Date: 03/23/2021 - -  Navigator Follow Up Date: 09/04/2021 02/28/2021 -  Navigator Follow Up Reason: Follow-up Appointment Surgery -  Navigator Location [place]  Referral Date to RadOnc/MedOnc - - 02/16/2021  Navigator Encounter Type Clinic/MDC/I spoke with Ms. Ground this week at her appt with Dr. Julien Nordmann. Her plan of care is to follow up in 6 months with a scan and appt to see Dr. Julien Nordmann. She verbalized understanding.  Clinic/MDC;Initial MedOnc Telephone  Telephone - - Outgoing Call  [facility] Date - - 02/16/2021  Multidisiplinary Clinic Type - - Thoracic  Treatment Initiated Date 03/23/2021 - -  Patient Visit Type MedOnc;Follow-up Initial;MedOnc -  Treatment Phase Treatment Abnormal Scans Pre-Tx/Tx Discussion  Barriers/Navigation Needs Education Education Coordination of Care;Education  Education Other Newly Diagnosed Cancer Education;Other Other  Interventions Education;Psycho-Social Support Education;Psycho-Social Support Coordination of Care;Education  Acuity Level 2-Minimal Needs (1-2 Barriers Identified) Level 3-Moderate Needs (3-4 Barriers Identified) Level 2-Minimal Needs (1-2 Barriers Identified)  Coordination of Care - Other Appts  Education Method Verbal Verbal;Written Verbal  Time Spent with Patient [phone]

## 2021-06-06 ENCOUNTER — Telehealth: Payer: Self-pay

## 2021-06-06 NOTE — Telephone Encounter (Signed)
Right flank pain and feels like her "vice grip" ?

## 2021-06-12 ENCOUNTER — Ambulatory Visit
Admission: EM | Admit: 2021-06-12 | Discharge: 2021-06-12 | Disposition: A | Discharge status: Home / Self Care | Payer: Medicare Other | Attending: Emergency Medicine | Admitting: Emergency Medicine

## 2021-06-12 DIAGNOSIS — R5383 Other fatigue: Secondary | ICD-10-CM | POA: Diagnosis not present

## 2021-06-12 DIAGNOSIS — J029 Acute pharyngitis, unspecified: Secondary | ICD-10-CM | POA: Diagnosis not present

## 2021-06-12 LAB — POCT RAPID STREP A (OFFICE): Rapid Strep A Screen: NEGATIVE

## 2021-06-12 MED ORDER — OSELTAMIVIR PHOSPHATE 75 MG PO CAPS: 75.0000 mg | ORAL_CAPSULE | Freq: Two times a day (BID) | ORAL | 0 refills | Status: AC

## 2021-06-12 NOTE — Discharge Instructions (Signed)
Your symptoms and physical exam findings are concerning for a viral respiratory infection. You were tested for both COVID-19 and influenza today, the result of your viral testing will be posted to your MyChart once it is complete, this typically takes 24 to 48 hours.  If there is a positive result, you will be contacted by phone with further recommendations, if any.  ? ?I recommend that you begin taking Tamiflu now for empiric treatment of presumed influenza based on the history provided to me today along with my physical exam findings. Tamiflu is an antiviral medication that decreases the severity, duration and transmissibility of influenza virus by preventing the virus from reproducing itself in your body.   ?  ?If the influenza result is positive, please continue the full 5-day course of Tamiflu.  If the result is negative, please feel free to discontinue Tamiflu if you prefer but do keep in mind that Tamiflu can also be taken preventatively.  Finishing the full 5-day course will decrease the chances of catching influenza from anyone else and will not cause harm otherwise.  ? ?If your COVID-19 test is positive, we will provide you with a prescription for Paxlovid, the antiviral for COVID-19. ?   ?You are welcome to continue Tylenol for your symptoms. ?  ?Please follow-up within the next 5-7 days either with your primary care provider or urgent care if your symptoms do not resolve.  If you do not have a primary care provider, we will assist you in finding one. ?  ?Thank you for visiting urgent care today.  We appreciate the opportunity to participate in your care. ? ?

## 2021-06-12 NOTE — ED Provider Notes (Addendum)
[facility]    CSN: [id number] Arrival date & time: 06/12/21  1410    HISTORY   Chief Complaint  Patient presents with   Sore Throat   HPI Donna Todd is a 66 y.o. female. Patient complains of a sore throat that began yesterday.  Patient states she took Tylenol around 1 PM today, 2 hours prior to arrival, patient's vital signs are normal on arrival.  Patient has a history of gastroesophageal reflux disease as well as adenocarcinoma of the lung status post robotic laparoscopic resection 03/2021.  Patient states she recently moved to this area, does not currently have a primary care provider.  Patient states she has felt significantly more fatigued than usual.  Patient denies nausea, vomiting, diarrhea, headache, fever, loss of taste or smell, cough, otalgia.  The history is provided by the patient.  Past Medical History:  Diagnosis Date   Attention deficit disorder    Depression    Patient Active Problem List   Diagnosis Date Noted   Primary adenocarcinoma of upper lobe of right lung (HCC) 04/05/2021   Mass of upper lobe of right lung 02/16/2021   Elevated troponin 11/17/2017   Nausea with vomiting 11/17/2017   Incidental lung nodule, greater than or equal to 8mm 11/17/2017   Tobacco abuse 11/17/2017   Attention deficit disorder (ADD) in adult 11/17/2017   Past Surgical History:  Procedure Laterality Date   ABDOMINAL HYSTERECTOMY     OB History   No obstetric history on file.    Home Medications    Prior to Admission medications   Medication Sig Start Date End Date Taking? Authorizing Provider  Biotin w/ Vitamins C & E (HAIR/SKIN/NAILS PO) Take 1 tablet by mouth daily.    [provider]  cyanocobalamin 1000 MCG tablet Take 1 tablet by mouth daily.    [provider]  DULoxetine (CYMBALTA) 60 MG capsule Take 60 mg by mouth daily. 01/23/21   [provider]  gabapentin (NEURONTIN) 300 MG capsule Take 300 mg by mouth 3 (three)  times daily. 05/02/21   [provider]  HYDROcodone-acetaminophen (NORCO/VICODIN) 5-325 MG tablet Take 1-2 tablets by mouth every 6 (six) hours as needed. 02/10/21   [provider]  KRILL OIL PO Take 1 tablet by mouth daily.    [provider]  naproxen (NAPROSYN) 500 MG tablet Take 500 mg by mouth 2 (two) times daily. 03/30/21   [provider]  oxyCODONE (OXY IR/ROXICODONE) 5 MG immediate release tablet Take 5 mg by mouth 4 (four) times daily as needed. 03/30/21   [provider]  traZODone (DESYREL) 50 MG tablet Take 25-50 mg by mouth at bedtime as needed. 03/30/21   [provider]  valACYclovir (VALTREX) 500 MG tablet Take 500 mg by mouth daily. 01/20/21   [provider]  varenicline (CHANTIX) 1 MG tablet Take 1 mg by mouth 2 (two) times daily. 09/15/20   [provider]   Family History Family History  Problem Relation Age of Onset   Skin cancer Mother    Diabetes Mellitus II Mother    Social History Social History   Tobacco Use   Smoking status: Former    Packs/day: 1.00    Years: 25.00    Pack years: 25.00    Types: Cigarettes   Smokeless tobacco: Never   Tobacco comments:    Pt reports she smokes off and on   Vaping Use   Vaping Use: Never used  Substance Use Topics  Alcohol use: Yes    Alcohol/week: 6.0 standard drinks    Types: 6 Cans of beer per week   Drug use: Yes    Types: Marijuana   Allergies   Sulfa antibiotics  Review of Systems Review of Systems Pertinent findings noted in history of present illness.   Physical Exam Triage Vital Signs ED Triage Vitals  Enc Vitals Group     BP 12/30/20 0827 (!) 147/82     Pulse Rate 12/30/20 0827 72     Resp 12/30/20 0827 18     Temp 12/30/20 0827 98.3 F (36.8 C)     Temp Source 12/30/20 0827 Oral     SpO2 12/30/20 0827 98 %     Weight --      Height --      Head Circumference --      Peak Flow --      Pain Score 12/30/20 0826 5      Pain Loc --      Pain Edu? --      Excl. in GC? --   No data found.  Updated Vital Signs BP 108/63 (BP Location: Left Arm)   Pulse 60   Temp 98.1 F (36.7 C) (Oral)   Resp 18   SpO2 97%   Physical Exam Vitals and nursing note reviewed.  Constitutional:      General: She is not in acute distress.    Appearance: Normal appearance. She is not ill-appearing.  HENT:     Head: Normocephalic and atraumatic.     Salivary Glands: Right salivary gland is not diffusely enlarged or tender. Left salivary gland is not diffusely enlarged or tender.     Right Ear: Tympanic membrane, ear canal and external ear normal. No drainage. No middle ear effusion. There is no impacted cerumen. Tympanic membrane is not erythematous or bulging.     Left Ear: Tympanic membrane, ear canal and external ear normal. No drainage.  No middle ear effusion. There is no impacted cerumen. Tympanic membrane is not erythematous or bulging.     Nose: Nose normal. No nasal deformity, septal deviation, mucosal edema, congestion or rhinorrhea.     Right Turbinates: Not enlarged, swollen or pale.     Left Turbinates: Not enlarged, swollen or pale.     Right Sinus: No maxillary sinus tenderness or frontal sinus tenderness.     Left Sinus: No maxillary sinus tenderness or frontal sinus tenderness.     Mouth/Throat:     Lips: Pink. No lesions.     Mouth: Mucous membranes are moist. No oral lesions.     Pharynx: Oropharynx is clear. Uvula midline. No posterior oropharyngeal erythema or uvula swelling.     Tonsils: No tonsillar exudate. 0 on the right. 0 on the left.  Eyes:     General: Lids are normal.        Right eye: No discharge.        Left eye: No discharge.     Extraocular Movements: Extraocular movements intact.     Conjunctiva/sclera: Conjunctivae normal.     Right eye: Right conjunctiva is not injected.     Left eye: Left conjunctiva is not injected.  Neck:     Trachea: Trachea and phonation normal.   Cardiovascular:     Rate and Rhythm: Normal rate and regular rhythm.     Pulses: Normal pulses.     Heart sounds: Normal heart sounds. No murmur heard.   No friction rub. No gallop.  Pulmonary:     Effort: Pulmonary effort is normal. No accessory muscle usage, prolonged expiration or respiratory distress.     Breath sounds: Normal breath sounds. No stridor, decreased air movement or transmitted upper airway sounds. No decreased breath sounds, wheezing, rhonchi or rales.  Chest:     Chest wall: No tenderness.  Musculoskeletal:        General: Normal range of motion.     Cervical back: Normal range of motion and neck supple. Normal range of motion.  Lymphadenopathy:     Cervical: No cervical adenopathy.  Skin:    General: Skin is warm and dry.     Findings: No erythema or rash.  Neurological:     General: No focal deficit present.     Mental Status: She is alert and oriented to person, place, and time.  Psychiatric:        Mood and Affect: Mood normal.        Behavior: Behavior normal.    Visual Acuity Right Eye Distance:   Left Eye Distance:   Bilateral Distance:    Right Eye Near:   Left Eye Near:    Bilateral Near:     UC Couse / Diagnostics / Procedures:    EKG  Radiology No results found.  Procedures Procedures (including critical care time)  UC Diagnoses / Final Clinical Impressions(s)   I have reviewed the triage vital signs and the nursing notes.  Pertinent labs & imaging results that were available during my care of the patient were reviewed by me and considered in my medical decision making (see chart for details).   Final diagnoses:  Pharyngitis, unspecified etiology  Other fatigue   Physical exam is unremarkable.  COVID and influenza testing performed.  Patient asked to be started on antiviral therapy, will start Tamiflu although my suspicion is low for influenza.  Patient advised that if her COVID-19 test is positive we will provide her with Paxlovid.   Conservative care recommended.  She can continue Tylenol if she likes.  Return precautions advised.  ED Prescriptions     Medication Sig Dispense Auth. Provider   oseltamivir (TAMIFLU) 75 MG capsule Take 1 capsule (75 mg total) by mouth every 12 (twelve) hours for 5 days. 10 capsule Theadora Rama Scales, PA-C      PDMP not reviewed this encounter.  Pending results:  Labs Reviewed  COVID-19, FLU A+B NAA  POCT RAPID STREP A (OFFICE)    Medications Ordered in UC: Medications - No data to display  Disposition Upon Discharge:  Condition: stable for discharge home Home: take medications as prescribed; routine discharge instructions as discussed; follow up as advised.  Patient presented with an acute illness with associated systemic symptoms and significant discomfort requiring urgent management. In my opinion, this is a condition that a prudent lay person (someone who possesses an average knowledge of health and medicine) may potentially expect to result in complications if not addressed urgently such as respiratory distress, impairment of bodily function or dysfunction of bodily organs.   Routine symptom specific, illness specific and/or disease specific instructions were discussed with the patient and/or caregiver at length.   As such, the patient has been evaluated and assessed, work-up was performed and treatment was provided in alignment with urgent care protocols and evidence based medicine.  Patient/parent/caregiver has been advised that the patient may require follow up for further testing and treatment if the symptoms continue in spite of treatment, as clinically indicated and appropriate.  If the  patient was tested for COVID-19, Influenza and/or RSV, then the patient/parent/guardian was advised to isolate at home pending the results of his/her diagnostic coronavirus test and potentially longer if they're positive. I have also advised pt that if his/her COVID-19 test returns  positive, it's recommended to self-isolate for at least 10 days after symptoms first appeared AND until fever-free for 24 hours without fever reducer AND other symptoms have improved or resolved. Discussed self-isolation recommendations as well as instructions for household member/close contacts as per the [facility] and [place] DHHS, and also gave patient the COVID packet with this information.  Patient/parent/caregiver has been advised to return to the [facility] or PCP in 3-5 days if no better; to PCP or the Emergency Department if new signs and symptoms develop, or if the current signs or symptoms continue to change or worsen for further workup, evaluation and treatment as clinically indicated and appropriate  The patient will follow up with their current PCP if and as advised. If the patient does not currently have a PCP we will assist them in obtaining one.   The patient may need specialty follow up if the symptoms continue, in spite of conservative treatment and management, for further workup, evaluation, consultation and treatment as clinically indicated and appropriate.  Patient/parent/caregiver verbalized understanding and agreement of plan as discussed.  All questions were addressed during visit.  Please see discharge instructions below for further details of plan.  Discharge Instructions:   Discharge Instructions      Your symptoms and physical exam findings are concerning for a viral respiratory infection. You were tested for both COVID-19 and influenza today, the result of your viral testing will be posted to your MyChart once it is complete, this typically takes 24 to 48 hours.  If there is a positive result, you will be contacted by phone with further recommendations, if any.   I recommend that you begin taking Tamiflu now for empiric treatment of presumed influenza based on the history provided to me today along with my physical exam findings. Tamiflu is an antiviral medication that decreases the  severity, duration and transmissibility of influenza virus by preventing the virus from reproducing itself in your body.     If the influenza result is positive, please continue the full 5-day course of Tamiflu.  If the result is negative, please feel free to discontinue Tamiflu if you prefer but do keep in mind that Tamiflu can also be taken preventatively.  Finishing the full 5-day course will decrease the chances of catching influenza from anyone else and will not cause harm otherwise.   If your COVID-19 test is positive, we will provide you with a prescription for Paxlovid, the antiviral for COVID-19.    You are welcome to continue Tylenol for your symptoms.   Please follow-up within the next 5-7 days either with your primary care provider or urgent care if your symptoms do not resolve.  If you do not have a primary care provider, we will assist you in finding one.   Thank you for visiting urgent care today.  We appreciate the opportunity to participate in your care.       This office note has been dictated using Teaching laboratory technician.  Unfortunately, and despite my best efforts, this method of dictation can sometimes lead to occasional typographical or grammatical errors.  I apologize in advance if this occurs.     Theadora Rama Scales, PA-C 06/12/21 1524    Theadora Rama Scales, PA-C 06/12/21 1525

## 2021-06-12 NOTE — ED Triage Notes (Addendum)
Pt c/o sore throat that began yesterday. ?Home interventions: tylenol around 1PM ?

## 2021-06-13 LAB — COVID-19, FLU A+B NAA
Influenza A, NAA: NOT DETECTED
Influenza B, NAA: NOT DETECTED
SARS-CoV-2, NAA: NOT DETECTED

## 2021-08-21 DIAGNOSIS — Z85118 Personal history of other malignant neoplasm of bronchus and lung: Secondary | ICD-10-CM | POA: Diagnosis not present

## 2021-08-21 DIAGNOSIS — G4700 Insomnia, unspecified: Secondary | ICD-10-CM | POA: Diagnosis not present

## 2021-08-22 ENCOUNTER — Telehealth: Payer: Self-pay | Admitting: Internal Medicine

## 2021-08-22 NOTE — Telephone Encounter (Signed)
Called patient regarding upcoming July appointments, patient has been and voicemail was left.

## 2021-08-24 ENCOUNTER — Encounter: Payer: Self-pay | Admitting: *Deleted

## 2021-08-24 NOTE — Progress Notes (Signed)
I followed up on Donna Todd plan of care. She is on observation and has a follow up with med onc next month.

## 2021-10-02 ENCOUNTER — Inpatient Hospital Stay: Payer: Medicare Other | Attending: Internal Medicine

## 2021-10-02 ENCOUNTER — Other Ambulatory Visit: Payer: Self-pay

## 2021-10-02 ENCOUNTER — Ambulatory Visit (HOSPITAL_COMMUNITY)
Admission: RE | Admit: 2021-10-02 | Discharge: 2021-10-02 | Disposition: A | Discharge status: Home / Self Care | Payer: Medicare Other | Source: Ambulatory Visit | Attending: Internal Medicine | Admitting: Internal Medicine

## 2021-10-02 DIAGNOSIS — C3490 Malignant neoplasm of unspecified part of unspecified bronchus or lung: Secondary | ICD-10-CM | POA: Insufficient documentation

## 2021-10-02 DIAGNOSIS — D472 Monoclonal gammopathy: Secondary | ICD-10-CM | POA: Insufficient documentation

## 2021-10-02 LAB — CMP (CANCER CENTER ONLY)
ALT: 10 U/L (ref 0–44)
AST: 17 U/L (ref 15–41)
Albumin: 4.1 g/dL (ref 3.5–5.0)
Alkaline Phosphatase: 49 U/L (ref 38–126)
Anion gap: 6 (ref 5–15)
BUN: 12 mg/dL (ref 8–23)
CO2: 25 mmol/L (ref 22–32)
Calcium: 9 mg/dL (ref 8.9–10.3)
Chloride: 109 mmol/L (ref 98–111)
Creatinine: 0.94 mg/dL (ref 0.44–1.00)
GFR, Estimated: >60 mL/min
Glucose, Bld: 96 mg/dL (ref 70–99)
Potassium: 4 mmol/L (ref 3.5–5.1)
Sodium: 140 mmol/L (ref 135–145)
Total Bilirubin: 0.5 mg/dL (ref 0.3–1.2)
Total Protein: 6.7 g/dL (ref 6.5–8.1)

## 2021-10-02 LAB — CBC WITH DIFFERENTIAL (CANCER CENTER ONLY)
Abs Immature Granulocytes: 0.01 10*3/uL (ref 0.00–0.07)
Basophils Absolute: 0.1 10*3/uL (ref 0.0–0.1)
Basophils Relative: 1 %
Eosinophils Absolute: 0.2 10*3/uL (ref 0.0–0.5)
Eosinophils Relative: 3 %
HCT: 35.8 % — ABNORMAL LOW (ref 36.0–46.0)
Hemoglobin: 12.2 g/dL (ref 12.0–15.0)
Immature Granulocytes: 0 %
Lymphocytes Relative: 38 %
Lymphs Abs: 2.1 10*3/uL (ref 0.7–4.0)
MCH: 30.4 pg (ref 26.0–34.0)
MCHC: 34.1 g/dL (ref 30.0–36.0)
MCV: 89.3 fL (ref 80.0–100.0)
Monocytes Absolute: 0.5 10*3/uL (ref 0.1–1.0)
Monocytes Relative: 10 %
Neutro Abs: 2.7 10*3/uL (ref 1.7–7.7)
Neutrophils Relative %: 48 %
Platelet Count: 253 10*3/uL (ref 150–400)
RBC: 4.01 MIL/uL (ref 3.87–5.11)
RDW: 14.4 % (ref 11.5–15.5)
WBC Count: 5.6 10*3/uL (ref 4.0–10.5)
nRBC: 0 % (ref 0.0–0.2)

## 2021-10-02 MED ORDER — IOHEXOL 300 MG/ML  SOLN
75.0000 mL | Freq: Once | INTRAMUSCULAR | Status: AC | PRN
  Administered 2021-10-02: 75 mL via INTRAVENOUS

## 2021-10-04 ENCOUNTER — Other Ambulatory Visit: Payer: Self-pay

## 2021-10-04 ENCOUNTER — Inpatient Hospital Stay: Payer: Medicare Other | Attending: Internal Medicine | Admitting: Internal Medicine

## 2021-10-04 VITALS — BP 121/64 | HR 56 | Temp 97.9°F | Resp 16 | Wt 113.1 lb

## 2021-10-04 DIAGNOSIS — C3490 Malignant neoplasm of unspecified part of unspecified bronchus or lung: Secondary | ICD-10-CM | POA: Diagnosis not present

## 2021-10-04 DIAGNOSIS — Z902 Acquired absence of lung [part of]: Secondary | ICD-10-CM | POA: Insufficient documentation

## 2021-10-04 DIAGNOSIS — C3411 Malignant neoplasm of upper lobe, right bronchus or lung: Secondary | ICD-10-CM | POA: Diagnosis not present

## 2021-10-04 DIAGNOSIS — Z85118 Personal history of other malignant neoplasm of bronchus and lung: Secondary | ICD-10-CM | POA: Diagnosis not present

## 2021-10-04 NOTE — Progress Notes (Signed)
[facility] [phone]   Fax:(336) [place], [place] [address] [place] [address]  DIAGNOSIS: stage IA (T1c, N0, M0) non-small cell lung cancer, invasive poorly differentiated adenocarcinoma, 65% acinar, 20% solid and 10% lipidic, 5% cribriform presented with right upper lobe lung nodule diagnosed in November 2022 .  PRIOR THERAPY: Status post right upper lobectomy with lymph node dissection under the care of Dr. Druscilla Brownie at [facility] on March 23, 2021.  The tumor size was 2.3 cm with negative visceral pleural or lymphovascular invasion.  She has positive EGFR mutation in exon 21 (L858R)  CURRENT THERAPY: Observation.  INTERVAL HISTORY: Donna Todd 66 y.o. female returns to the clinic today for follow-up visit.  The patient is feeling fine today with no concerning complaints except for intermittent pain and neuropathy in the right side of the chest from the surgical scar.  She denied having any current shortness of breath, cough or hemoptysis.  She has no nausea, vomiting, diarrhea or constipation.  She denied having any headache or visual changes.  She has no weight loss or night sweats.  She had repeat CT scan of the chest performed recently and she is here for evaluation and discussion of her scan results.  MEDICAL HISTORY: Past Medical History:  Diagnosis Date   Attention deficit disorder    Depression     ALLERGIES:  is allergic to sulfa antibiotics.  MEDICATIONS:  Current Outpatient Medications  Medication Sig Dispense Refill   Biotin w/ Vitamins C & E (HAIR/SKIN/NAILS PO) Take 1 tablet by mouth daily.     cyanocobalamin 1000 MCG tablet Take 1 tablet by mouth daily.     DULoxetine (CYMBALTA) 60 MG capsule Take 60 mg by mouth daily.     gabapentin (NEURONTIN) 300 MG capsule Take 300 mg by mouth 3 (three) times daily.     HYDROcodone-acetaminophen (NORCO/VICODIN) 5-325 MG  tablet Take 1-2 tablets by mouth every 6 (six) hours as needed.     KRILL OIL PO Take 1 tablet by mouth daily.     naproxen (NAPROSYN) 500 MG tablet Take 500 mg by mouth 2 (two) times daily.     traZODone (DESYREL) 50 MG tablet Take 25-50 mg by mouth at bedtime as needed.     valACYclovir (VALTREX) 500 MG tablet Take 500 mg by mouth daily.     varenicline (CHANTIX) 1 MG tablet Take 1 mg by mouth 2 (two) times daily.     No current facility-administered medications for this visit.    SURGICAL HISTORY:  Past Surgical History:  Procedure Laterality Date   ABDOMINAL HYSTERECTOMY      REVIEW OF SYSTEMS:  A comprehensive review of systems was negative except for: Respiratory: positive for pleurisy/chest pain   PHYSICAL EXAMINATION: General appearance: alert, cooperative, appears stated age, and no distress Head: Normocephalic, without obvious abnormality, atraumatic Neck: no adenopathy, no JVD, supple, symmetrical, trachea midline, and thyroid not enlarged, symmetric, no tenderness/mass/nodules Lymph nodes: Cervical, supraclavicular, and axillary nodes normal. Resp: clear to auscultation bilaterally Back: symmetric, no curvature. ROM normal. No CVA tenderness. Cardio: regular rate and rhythm, S1, S2 normal, no murmur, click, rub or gallop GI: soft, non-tender; bowel sounds normal; no masses,  no organomegaly Extremities: extremities normal, atraumatic, no cyanosis or edema  ECOG PERFORMANCE STATUS: 1 - Symptomatic but completely ambulatory  Blood pressure 121/64, pulse (!) 56, temperature 97.9 F (36.6 C), temperature source  Oral, resp. rate 16, weight 113 lb 1 oz (51.3 kg), SpO2 100 %.  LABORATORY DATA: Lab Results  Component Value Date   WBC 5.6 10/02/2021   HGB 12.2 10/02/2021   HCT 35.8 (L) 10/02/2021   MCV 89.3 10/02/2021   PLT 253 10/02/2021      Chemistry      Component Value Date/Time   NA 140 10/02/2021 1547   K 4.0 10/02/2021 1547   CL 109 10/02/2021 1547   CO2 25  10/02/2021 1547   BUN 12 10/02/2021 1547   CREATININE 0.94 10/02/2021 1547      Component Value Date/Time   CALCIUM 9.0 10/02/2021 1547   ALKPHOS 49 10/02/2021 1547   AST 17 10/02/2021 1547   ALT 10 10/02/2021 1547   BILITOT 0.5 10/02/2021 1547       RADIOGRAPHIC STUDIES:   ASSESSMENT AND PLAN: This is a very pleasant 66 years old white female recently diagnosed with stage IA (T1c, N0, M0) non-small cell lung cancer, invasive poorly differentiated adenocarcinoma diagnosed in November 2022 status post right upper lobectomy with lymph node dissection on March 23, 2021 under the care of Dr. Druscilla Brownie.  The resected tumor measured 2.3 cm with negative lymph node dissection as well as no evidence for visceral pleural or lymphovascular invasion and it was positive for EGFR mutation in exon 21 (E332R). The patient is currently on observation and she is feeling fine with no concerning complaints. She had repeat CT scan of the chest performed recently.  I personally and independently reviewed the scans and discussed the results with the patient today. Her scan showed no concerning findings for disease recurrence or metastasis and she continues to have the small pulmonary nodules that need close monitoring on upcoming imaging studies. The patient was advised to call immediately if she has any other concerning symptoms in the interval. The patient voices understanding of current disease status and treatment options and is in agreement with the current care plan.  All questions were answered. The patient knows to call the clinic with any problems, questions or concerns. We can certainly see the patient much sooner if necessary.  The total time spent in the appointment was 20 minutes.  Disclaimer: This note was dictated with voice recognition software. Similar sounding words can inadvertently be transcribed and may not be corrected upon review. old white female recently diagnosed with stage IA (T1c, N0, M0) non-small cell lung cancer, invasive poorly differentiated adenocarcinoma diagnosed in November 2022 status post right upper lobectomy with lymph node dissection on March 23, 2021 under the care of Dr. Druscilla Brownie.  The resected tumor measured 2.3 cm with negative lymph node dissection as well as no evidence for visceral pleural or lymphovascular invasion and it was positive for EGFR mutation in exon 21 (E332R). The patient is currently on observation and she is feeling fine with no concerning complaints. She had repeat CT scan of the chest performed recently.  I personally and independently reviewed the scans and discussed the results with the patient today. Her scan showed no concerning findings for disease recurrence or metastasis and she continues to have the small pulmonary nodules that need close monitoring on upcoming imaging studies. The patient was advised to call immediately if she has any other concerning symptoms in the interval. The patient voices understanding of current disease status and treatment options and is in agreement with the current care plan.  All questions were answered. The patient knows to call the clinic with any problems, questions or concerns. We can certainly see the patient much sooner if necessary.  The total time spent in the appointment was 20 minutes.  Disclaimer: This note was dictated with voice recognition software. Similar sounding words can inadvertently be transcribed and may not be corrected upon review.

## 2022-03-01 DIAGNOSIS — Z720 Tobacco use: Secondary | ICD-10-CM | POA: Diagnosis not present

## 2022-03-01 DIAGNOSIS — I700 Atherosclerosis of aorta: Secondary | ICD-10-CM | POA: Diagnosis not present

## 2022-03-01 DIAGNOSIS — Z85118 Personal history of other malignant neoplasm of bronchus and lung: Secondary | ICD-10-CM | POA: Diagnosis not present

## 2022-03-01 DIAGNOSIS — G4700 Insomnia, unspecified: Secondary | ICD-10-CM | POA: Diagnosis not present

## 2022-03-29 DIAGNOSIS — E785 Hyperlipidemia, unspecified: Secondary | ICD-10-CM | POA: Diagnosis not present

## 2022-03-29 DIAGNOSIS — I700 Atherosclerosis of aorta: Secondary | ICD-10-CM | POA: Diagnosis not present

## 2022-04-06 ENCOUNTER — Inpatient Hospital Stay: Payer: Medicare Other | Attending: Internal Medicine

## 2022-04-06 ENCOUNTER — Other Ambulatory Visit: Payer: Self-pay

## 2022-04-06 DIAGNOSIS — C3411 Malignant neoplasm of upper lobe, right bronchus or lung: Secondary | ICD-10-CM | POA: Insufficient documentation

## 2022-04-06 DIAGNOSIS — C3490 Malignant neoplasm of unspecified part of unspecified bronchus or lung: Secondary | ICD-10-CM

## 2022-04-06 LAB — CBC WITH DIFFERENTIAL (CANCER CENTER ONLY)
Abs Immature Granulocytes: 0.01 10*3/uL (ref 0.00–0.07)
Basophils Absolute: 0.1 10*3/uL (ref 0.0–0.1)
Basophils Relative: 1 %
Eosinophils Absolute: 0.1 10*3/uL (ref 0.0–0.5)
Eosinophils Relative: 3 %
HCT: 35.2 % — ABNORMAL LOW (ref 36.0–46.0)
Hemoglobin: 12.1 g/dL (ref 12.0–15.0)
Immature Granulocytes: 0 %
Lymphocytes Relative: 27 %
Lymphs Abs: 1.3 10*3/uL (ref 0.7–4.0)
MCH: 30.9 pg (ref 26.0–34.0)
MCHC: 34.4 g/dL (ref 30.0–36.0)
MCV: 90 fL (ref 80.0–100.0)
Monocytes Absolute: 0.5 10*3/uL (ref 0.1–1.0)
Monocytes Relative: 9 %
Neutro Abs: 2.9 10*3/uL (ref 1.7–7.7)
Neutrophils Relative %: 60 %
Platelet Count: 231 10*3/uL (ref 150–400)
RBC: 3.91 MIL/uL (ref 3.87–5.11)
RDW: 12.9 % (ref 11.5–15.5)
WBC Count: 4.9 10*3/uL (ref 4.0–10.5)
nRBC: 0 % (ref 0.0–0.2)

## 2022-04-06 LAB — CMP (CANCER CENTER ONLY)
ALT: 11 U/L (ref 0–44)
AST: 15 U/L (ref 15–41)
Albumin: 3.8 g/dL (ref 3.5–5.0)
Alkaline Phosphatase: 46 U/L (ref 38–126)
Anion gap: 7 (ref 5–15)
BUN: 14 mg/dL (ref 8–23)
CO2: 26 mmol/L (ref 22–32)
Calcium: 9.1 mg/dL (ref 8.9–10.3)
Chloride: 107 mmol/L (ref 98–111)
Creatinine: 0.9 mg/dL (ref 0.44–1.00)
GFR, Estimated: >60 mL/min (ref >60)
Glucose, Bld: 96 mg/dL (ref 70–99)
Potassium: 4.2 mmol/L (ref 3.5–5.1)
Sodium: 140 mmol/L (ref 135–145)
Total Bilirubin: 0.5 mg/dL (ref 0.3–1.2)
Total Protein: 6 g/dL — ABNORMAL LOW (ref 6.5–8.1)

## 2022-04-09 ENCOUNTER — Inpatient Hospital Stay: Payer: Medicare Other | Admitting: Internal Medicine

## 2022-04-12 DIAGNOSIS — B070 Plantar wart: Secondary | ICD-10-CM | POA: Diagnosis not present

## 2022-04-19 ENCOUNTER — Inpatient Hospital Stay: Payer: Medicare Other | Admitting: Internal Medicine

## 2022-04-26 DIAGNOSIS — B070 Plantar wart: Secondary | ICD-10-CM | POA: Diagnosis not present

## 2022-07-18 DIAGNOSIS — Z720 Tobacco use: Secondary | ICD-10-CM | POA: Diagnosis not present

## 2022-07-18 DIAGNOSIS — G4700 Insomnia, unspecified: Secondary | ICD-10-CM | POA: Diagnosis not present

## 2022-07-18 DIAGNOSIS — Z0000 Encounter for general adult medical examination without abnormal findings: Secondary | ICD-10-CM | POA: Diagnosis not present

## 2022-07-18 DIAGNOSIS — I700 Atherosclerosis of aorta: Secondary | ICD-10-CM | POA: Diagnosis not present

## 2022-07-18 DIAGNOSIS — Z23 Encounter for immunization: Secondary | ICD-10-CM | POA: Diagnosis not present

## 2022-07-18 DIAGNOSIS — Z85118 Personal history of other malignant neoplasm of bronchus and lung: Secondary | ICD-10-CM | POA: Diagnosis not present

## 2022-07-18 DIAGNOSIS — B070 Plantar wart: Secondary | ICD-10-CM | POA: Diagnosis not present

## 2022-07-18 DIAGNOSIS — E785 Hyperlipidemia, unspecified: Secondary | ICD-10-CM | POA: Diagnosis not present

## 2022-07-18 DIAGNOSIS — Z13820 Encounter for screening for osteoporosis: Secondary | ICD-10-CM | POA: Diagnosis not present

## 2022-07-18 DIAGNOSIS — Z1159 Encounter for screening for other viral diseases: Secondary | ICD-10-CM | POA: Diagnosis not present

## 2022-07-18 DIAGNOSIS — R7309 Other abnormal glucose: Secondary | ICD-10-CM | POA: Diagnosis not present

## 2022-07-25 ENCOUNTER — Other Ambulatory Visit: Payer: Self-pay | Admitting: Family Medicine

## 2022-07-25 DIAGNOSIS — Z1231 Encounter for screening mammogram for malignant neoplasm of breast: Secondary | ICD-10-CM

## 2022-07-27 ENCOUNTER — Other Ambulatory Visit: Payer: Self-pay | Admitting: Family Medicine

## 2022-07-27 ENCOUNTER — Ambulatory Visit: Payer: Medicare Other | Admitting: Podiatry

## 2022-07-27 ENCOUNTER — Encounter: Payer: Self-pay | Admitting: Podiatry

## 2022-07-27 DIAGNOSIS — E2839 Other primary ovarian failure: Secondary | ICD-10-CM

## 2022-07-27 DIAGNOSIS — B070 Plantar wart: Secondary | ICD-10-CM

## 2022-07-27 MED ORDER — FLUOROURACIL 5 % EX CREA: TOPICAL_CREAM | Freq: Two times a day (BID) | CUTANEOUS | 2 refills | Status: AC

## 2022-07-31 NOTE — Progress Notes (Signed)
Subjective:   Patient ID: Donna Todd, female   DOB: 67 y.o.   MRN: [id number]   HPI Patient presents with a chronic lesion x 2 plantar right heel 3 years duration.  Patient's had it frozen several times it has not been successful and has tried OTC medicines at home.  Patient does not smoke and tries to be active   Review of Systems  All other systems reviewed and are negative.       Objective:  Physical Exam Vitals and nursing note reviewed.  Constitutional:      Appearance: She is well-developed.  Pulmonary:     Effort: Pulmonary effort is normal.  Musculoskeletal:        General: Normal range of motion.  Skin:    General: Skin is warm.  Neurological:     Mental Status: She is alert.     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  Explained what to do if blistering were to occur and she will start home Efudex usage and be seen back 6 weeks

## 2022-10-16 ENCOUNTER — Ambulatory Visit
Admission: RE | Admit: 2022-10-16 | Discharge: 2022-10-16 | Disposition: A | Discharge status: Home / Self Care | Payer: Medicare Other | Source: Ambulatory Visit | Attending: Family Medicine | Admitting: Family Medicine

## 2022-10-16 DIAGNOSIS — Z1231 Encounter for screening mammogram for malignant neoplasm of breast: Secondary | ICD-10-CM

## 2022-12-07 DIAGNOSIS — G4700 Insomnia, unspecified: Secondary | ICD-10-CM | POA: Diagnosis not present

## 2022-12-07 DIAGNOSIS — R7309 Other abnormal glucose: Secondary | ICD-10-CM | POA: Diagnosis not present

## 2022-12-07 DIAGNOSIS — Z720 Tobacco use: Secondary | ICD-10-CM | POA: Diagnosis not present

## 2022-12-07 DIAGNOSIS — I700 Atherosclerosis of aorta: Secondary | ICD-10-CM | POA: Diagnosis not present

## 2022-12-07 DIAGNOSIS — Z85118 Personal history of other malignant neoplasm of bronchus and lung: Secondary | ICD-10-CM | POA: Diagnosis not present

## 2022-12-07 DIAGNOSIS — E785 Hyperlipidemia, unspecified: Secondary | ICD-10-CM | POA: Diagnosis not present

## 2022-12-07 DIAGNOSIS — R413 Other amnesia: Secondary | ICD-10-CM | POA: Diagnosis not present

## 2022-12-25 NOTE — Progress Notes (Signed)
Message sent to scheduler via IB to make a follow up appt for the pt with Dr. Arbutus Ped.

## 2022-12-26 ENCOUNTER — Telehealth: Payer: Self-pay | Admitting: Internal Medicine

## 2022-12-26 NOTE — Telephone Encounter (Signed)
Called patient regarding October appointments, patient is notified.

## 2022-12-28 ENCOUNTER — Other Ambulatory Visit: Payer: Self-pay | Admitting: Medical Oncology

## 2022-12-28 DIAGNOSIS — C3490 Malignant neoplasm of unspecified part of unspecified bronchus or lung: Secondary | ICD-10-CM

## 2022-12-31 ENCOUNTER — Inpatient Hospital Stay: Payer: Medicare Other | Admitting: Internal Medicine

## 2022-12-31 ENCOUNTER — Inpatient Hospital Stay: Payer: Medicare Other | Attending: Internal Medicine

## 2022-12-31 VITALS — BP 104/51 | HR 49 | Temp 97.6°F | Resp 17 | Ht 64.0 in | Wt 112.6 lb

## 2022-12-31 DIAGNOSIS — Z85118 Personal history of other malignant neoplasm of bronchus and lung: Secondary | ICD-10-CM | POA: Insufficient documentation

## 2022-12-31 DIAGNOSIS — C3490 Malignant neoplasm of unspecified part of unspecified bronchus or lung: Secondary | ICD-10-CM | POA: Diagnosis not present

## 2022-12-31 DIAGNOSIS — Z902 Acquired absence of lung [part of]: Secondary | ICD-10-CM | POA: Insufficient documentation

## 2022-12-31 DIAGNOSIS — F17200 Nicotine dependence, unspecified, uncomplicated: Secondary | ICD-10-CM | POA: Diagnosis not present

## 2022-12-31 DIAGNOSIS — Z79899 Other long term (current) drug therapy: Secondary | ICD-10-CM | POA: Insufficient documentation

## 2022-12-31 LAB — CBC WITH DIFFERENTIAL (CANCER CENTER ONLY)
Abs Immature Granulocytes: 0.01 10*3/uL (ref 0.00–0.07)
Basophils Absolute: 0 10*3/uL (ref 0.0–0.1)
Basophils Relative: 1 %
Eosinophils Absolute: 0.2 10*3/uL (ref 0.0–0.5)
Eosinophils Relative: 3 %
HCT: 34.8 % — ABNORMAL LOW (ref 36.0–46.0)
Hemoglobin: 11.8 g/dL — ABNORMAL LOW (ref 12.0–15.0)
Immature Granulocytes: 0 %
Lymphocytes Relative: 37 %
Lymphs Abs: 1.7 10*3/uL (ref 0.7–4.0)
MCH: 31.2 pg (ref 26.0–34.0)
MCHC: 33.9 g/dL (ref 30.0–36.0)
MCV: 92.1 fL (ref 80.0–100.0)
Monocytes Absolute: 0.6 10*3/uL (ref 0.1–1.0)
Monocytes Relative: 12 %
Neutro Abs: 2.2 10*3/uL (ref 1.7–7.7)
Neutrophils Relative %: 47 %
Platelet Count: 219 10*3/uL (ref 150–400)
RBC: 3.78 MIL/uL — ABNORMAL LOW (ref 3.87–5.11)
RDW: 13.1 % (ref 11.5–15.5)
WBC Count: 4.7 10*3/uL (ref 4.0–10.5)
nRBC: 0 % (ref 0.0–0.2)

## 2022-12-31 LAB — CMP (CANCER CENTER ONLY)
ALT: 10 U/L (ref 0–44)
AST: 17 U/L (ref 15–41)
Albumin: 4.1 g/dL (ref 3.5–5.0)
Alkaline Phosphatase: 45 U/L (ref 38–126)
Anion gap: 4 — ABNORMAL LOW (ref 5–15)
BUN: 15 mg/dL (ref 8–23)
CO2: 28 mmol/L (ref 22–32)
Calcium: 9.2 mg/dL (ref 8.9–10.3)
Chloride: 107 mmol/L (ref 98–111)
Creatinine: 0.95 mg/dL (ref 0.44–1.00)
GFR, Estimated: >60 mL/min (ref >60)
Glucose, Bld: 92 mg/dL (ref 70–99)
Potassium: 4 mmol/L (ref 3.5–5.1)
Sodium: 139 mmol/L (ref 135–145)
Total Bilirubin: 0.5 mg/dL (ref 0.3–1.2)
Total Protein: 6.4 g/dL — ABNORMAL LOW (ref 6.5–8.1)

## 2022-12-31 NOTE — Progress Notes (Signed)
[facility] [phone]   Fax:([phone]) [phone]  OFFICE PROGRESS NOTE  Irven Coe, MD [address] [place] [address]  DIAGNOSIS: stage IA (T1c, N0, M0) non-small cell lung cancer, invasive poorly differentiated adenocarcinoma, 65% acinar, 20% solid and 10% lipidic, 5% cribriform presented with right upper lobe lung nodule diagnosed in November 2022 .  PRIOR THERAPY: Status post right upper lobectomy with lymph node dissection under the care of Dr. Mickle Mallory at [facility] on March 23, 2021.  The tumor size was 2.3 cm with negative visceral pleural or lymphovascular invasion.  She has positive EGFR mutation in exon 21 (L858R)  CURRENT THERAPY: Observation.  INTERVAL HISTORY: Donna Todd 67 y.o. female returns to the clinic today for follow-up visit.  The patient missed her previous appointments.Discussed the use of AI scribe software for clinical note transcription with the patient, who gave verbal consent to proceed.  History of Present Illness   Donna Todd, a 67 year old patient, was diagnosed with stage 1A non-small cell lung cancer, adenocarcinoma, with a positive EGFR mutation in exon twenty one in November 2022. Subsequently, they underwent a right upper lobectomy in January 2023 at [facility]. Since the surgery, the patient has been under observation with no additional treatment.  The patient's last consultation was in August 2023, and they underwent a scan in July 2023, which showed no concerning findings. However, there was a lapse in follow-up, and the patient did not have the recommended six-month scan or blood work.  The patient seemed receptive to this alternative treatment option.        MEDICAL HISTORY: Past Medical History:  Diagnosis Date   Attention deficit disorder    Depression     ALLERGIES:  is allergic to sulfa antibiotics.  MEDICATIONS:  Current Outpatient Medications   Medication Sig Dispense Refill   ALPRAZolam (XANAX) 0.5 MG tablet 1/2 to 1 tablet Orally Twice a day As needed     Biotin w/ Vitamins C & E (HAIR/SKIN/NAILS PO) Take 1 tablet by mouth daily.     buPROPion (WELLBUTRIN XL) 150 MG 24 hr tablet 1 tablet in the morning Orally Once a day     cyanocobalamin 1000 MCG tablet Take 1 tablet by mouth daily.     DULoxetine (CYMBALTA) 60 MG capsule Take 60 mg by mouth daily.     escitalopram (LEXAPRO) 10 MG tablet Take 10 mg by mouth daily.     fluorouracil (EFUDEX) 5 % cream Apply topically 2 (two) times daily. 40 g 2   gabapentin (NEURONTIN) 300 MG capsule Take 300 mg by mouth 3 (three) times daily.     HYDROcodone-acetaminophen (NORCO/VICODIN) 5-325 MG tablet Take 1-2 tablets by mouth every 6 (six) hours as needed.     KRILL OIL PO Take 1 tablet by mouth daily.     naproxen (NAPROSYN) 500 MG tablet Take 500 mg by mouth 2 (two) times daily.     traZODone (DESYREL) 50 MG tablet Take 25-50 mg by mouth at bedtime as needed.     valACYclovir (VALTREX) 500 MG tablet Take 500 mg by mouth daily. (Patient not taking: Reported on 10/04/2021)     varenicline (CHANTIX) 1 MG tablet Take 1 mg by mouth 2 (two) times daily.     No current facility-administered medications for this visit.    SURGICAL HISTORY:  Past Surgical History:  Procedure Laterality Date   ABDOMINAL HYSTERECTOMY      REVIEW  OF SYSTEMS:  A comprehensive review of systems was negative except for: Respiratory: positive for pleurisy/chest pain   PHYSICAL EXAMINATION: General appearance: alert, cooperative, appears stated age, and no distress Head: Normocephalic, without obvious abnormality, atraumatic Neck: no adenopathy, no JVD, supple, symmetrical, trachea midline, and thyroid not enlarged, symmetric, no tenderness/mass/nodules Lymph nodes: Cervical, supraclavicular, and axillary nodes normal. Resp: clear to auscultation bilaterally Back: symmetric, no curvature. ROM normal. No CVA  tenderness. Cardio: regular rate and rhythm, S1, S2 normal, no murmur, click, rub or gallop GI: soft, non-tender; bowel sounds normal; no masses,  no organomegaly Extremities: extremities normal, atraumatic, no cyanosis or edema  ECOG PERFORMANCE STATUS: 1 - Symptomatic but completely ambulatory  Blood pressure (!) 104/51, pulse (!) 49, temperature 97.6 F (36.4 C), temperature source Oral, resp. rate 17, height 5\' 4"  (1.626 m), weight 112 lb 9.6 oz (51.1 kg), SpO2 99%.  LABORATORY DATA: Lab Results  Component Value Date   WBC 4.7 12/31/2022   HGB 11.8 (L) 12/31/2022   HCT 34.8 (L) 12/31/2022   MCV 92.1 12/31/2022   PLT 219 12/31/2022      Chemistry      Component Value Date/Time   NA 140 04/06/2022 1016   K 4.2 04/06/2022 1016   CL 107 04/06/2022 1016   CO2 26 04/06/2022 1016   BUN 14 04/06/2022 1016   CREATININE 0.90 04/06/2022 1016      Component Value Date/Time   CALCIUM 9.1 04/06/2022 1016   ALKPHOS 46 04/06/2022 1016   AST 15 04/06/2022 1016   ALT 11 04/06/2022 1016   BILITOT 0.5 04/06/2022 1016       RADIOGRAPHIC STUDIES:   ASSESSMENT AND PLAN: This is a very pleasant 67 years old white female recently diagnosed with stage IA (T1c, N0, M0) non-small cell lung cancer, invasive poorly differentiated adenocarcinoma diagnosed in November 2022 status post right upper lobectomy with lymph node dissection on March 23, 2021 under the care of Dr. Mickle Mallory.  The resected tumor measured 2.3 cm with negative lymph node dissection as well as no evidence for visceral pleural or lymphovascular invasion and it was positive for EGFR mutation in exon 21 ([id number]).   The patient missed few of her follow-up visit and imaging studies recently.    Stage 1A Non-Small Cell Lung Cancer (Adenocarcinoma) with positive EGFR mutation Patient underwent right upper lobectomy in January 2023. Last scan was in July 2023 and was clear. No recent imaging or follow-up since then. Discussed the  importance of regular follow-up and imaging to monitor for recurrence. Patient expressed reluctance for further surgery or chemo/radiation if cancer recurs, but was receptive to targeted therapy due to EGFR mutation. -Order CT scan to be done within the next week. -If scan is clear, follow-up in 6 months. -If scan shows concerning findings, patient to be seen sooner. -Reminded patient to contact office if no follow-up appointment is scheduled within the recommended timeframe.   She was advised to call immediately if she has any other concerning symptoms in the interval. The patient voices understanding of current disease status and treatment options and is in agreement with the current care plan.  All questions were answered. The patient knows to call the clinic with any problems, questions or concerns. We can certainly see the patient much sooner if necessary.  The total time spent in the appointment was 20 minutes.  Disclaimer: This note was dictated with voice recognition software. Similar sounding words can inadvertently be transcribed and may not be  corrected upon review.

## 2023-01-07 DIAGNOSIS — B070 Plantar wart: Secondary | ICD-10-CM | POA: Diagnosis not present

## 2023-03-28 ENCOUNTER — Ambulatory Visit
Admission: RE | Admit: 2023-03-28 | Discharge: 2023-03-28 | Disposition: A | Discharge status: Home / Self Care | Payer: Medicare Other | Source: Ambulatory Visit | Attending: Family Medicine | Admitting: Family Medicine

## 2023-03-28 DIAGNOSIS — M8588 Other specified disorders of bone density and structure, other site: Secondary | ICD-10-CM | POA: Diagnosis not present

## 2023-03-28 DIAGNOSIS — E2839 Other primary ovarian failure: Secondary | ICD-10-CM

## 2023-04-09 DIAGNOSIS — Z711 Person with feared health complaint in whom no diagnosis is made: Secondary | ICD-10-CM | POA: Diagnosis not present

## 2023-06-21 ENCOUNTER — Inpatient Hospital Stay: Payer: Medicare Other | Attending: Internal Medicine

## 2023-06-21 DIAGNOSIS — F17210 Nicotine dependence, cigarettes, uncomplicated: Secondary | ICD-10-CM | POA: Insufficient documentation

## 2023-06-21 DIAGNOSIS — Z08 Encounter for follow-up examination after completed treatment for malignant neoplasm: Secondary | ICD-10-CM | POA: Insufficient documentation

## 2023-06-21 DIAGNOSIS — Z85118 Personal history of other malignant neoplasm of bronchus and lung: Secondary | ICD-10-CM | POA: Diagnosis not present

## 2023-06-21 DIAGNOSIS — C3490 Malignant neoplasm of unspecified part of unspecified bronchus or lung: Secondary | ICD-10-CM

## 2023-06-21 DIAGNOSIS — Z91199 Patient's noncompliance with other medical treatment and regimen due to unspecified reason: Secondary | ICD-10-CM | POA: Insufficient documentation

## 2023-06-21 DIAGNOSIS — Z79899 Other long term (current) drug therapy: Secondary | ICD-10-CM | POA: Insufficient documentation

## 2023-06-21 DIAGNOSIS — Z902 Acquired absence of lung [part of]: Secondary | ICD-10-CM | POA: Insufficient documentation

## 2023-06-21 LAB — CBC WITH DIFFERENTIAL (CANCER CENTER ONLY)
Abs Immature Granulocytes: 0.02 10*3/uL (ref 0.00–0.07)
Basophils Absolute: 0.1 10*3/uL (ref 0.0–0.1)
Basophils Relative: 1 %
Eosinophils Absolute: 0.1 10*3/uL (ref 0.0–0.5)
Eosinophils Relative: 2 %
HCT: 38.2 % (ref 36.0–46.0)
Hemoglobin: 13.1 g/dL (ref 12.0–15.0)
Immature Granulocytes: 0 %
Lymphocytes Relative: 26 %
Lymphs Abs: 1.6 10*3/uL (ref 0.7–4.0)
MCH: 31 pg (ref 26.0–34.0)
MCHC: 34.3 g/dL (ref 30.0–36.0)
MCV: 90.5 fL (ref 80.0–100.0)
Monocytes Absolute: 0.6 10*3/uL (ref 0.1–1.0)
Monocytes Relative: 11 %
Neutro Abs: 3.7 10*3/uL (ref 1.7–7.7)
Neutrophils Relative %: 60 %
Platelet Count: 260 10*3/uL (ref 150–400)
RBC: 4.22 MIL/uL (ref 3.87–5.11)
RDW: 13.9 % (ref 11.5–15.5)
WBC Count: 6.1 10*3/uL (ref 4.0–10.5)
nRBC: 0 % (ref 0.0–0.2)

## 2023-06-21 LAB — CMP (CANCER CENTER ONLY)
ALT: 11 U/L (ref 0–44)
AST: 17 U/L (ref 15–41)
Albumin: 4.4 g/dL (ref 3.5–5.0)
Alkaline Phosphatase: 44 U/L (ref 38–126)
Anion gap: 4 — ABNORMAL LOW (ref 5–15)
BUN: 13 mg/dL (ref 8–23)
CO2: 29 mmol/L (ref 22–32)
Calcium: 9.4 mg/dL (ref 8.9–10.3)
Chloride: 106 mmol/L (ref 98–111)
Creatinine: 0.92 mg/dL (ref 0.44–1.00)
GFR, Estimated: >60 mL/min (ref >60)
Glucose, Bld: 88 mg/dL (ref 70–99)
Potassium: 4.7 mmol/L (ref 3.5–5.1)
Sodium: 139 mmol/L (ref 135–145)
Total Bilirubin: 0.4 mg/dL (ref 0.0–1.2)
Total Protein: 6.8 g/dL (ref 6.5–8.1)

## 2023-06-27 ENCOUNTER — Inpatient Hospital Stay: Payer: Medicare Other | Admitting: Internal Medicine

## 2023-06-27 ENCOUNTER — Telehealth: Payer: Self-pay | Admitting: Medical Oncology

## 2023-06-27 VITALS — BP 113/46 | HR 54 | Temp 98.1°F | Resp 16 | Ht 64.0 in | Wt 109.3 lb

## 2023-06-27 DIAGNOSIS — C3411 Malignant neoplasm of upper lobe, right bronchus or lung: Secondary | ICD-10-CM | POA: Diagnosis not present

## 2023-06-27 DIAGNOSIS — Z08 Encounter for follow-up examination after completed treatment for malignant neoplasm: Secondary | ICD-10-CM | POA: Diagnosis not present

## 2023-06-27 DIAGNOSIS — Z91199 Patient's noncompliance with other medical treatment and regimen due to unspecified reason: Secondary | ICD-10-CM | POA: Diagnosis not present

## 2023-06-27 DIAGNOSIS — Z85118 Personal history of other malignant neoplasm of bronchus and lung: Secondary | ICD-10-CM | POA: Diagnosis not present

## 2023-06-27 DIAGNOSIS — F17210 Nicotine dependence, cigarettes, uncomplicated: Secondary | ICD-10-CM | POA: Diagnosis not present

## 2023-06-27 DIAGNOSIS — Z902 Acquired absence of lung [part of]: Secondary | ICD-10-CM | POA: Diagnosis not present

## 2023-06-27 DIAGNOSIS — Z79899 Other long term (current) drug therapy: Secondary | ICD-10-CM | POA: Diagnosis not present

## 2023-06-27 NOTE — Telephone Encounter (Signed)
 Pt notified of CT appt.

## 2023-06-27 NOTE — Progress Notes (Signed)
 [facility] Telephone:([phone]) [phone]   Fax:(336) [phone]  OFFICE PROGRESS NOTE  Donna Brady, MD [address] [place] [address]  DIAGNOSIS: stage IA (T1c, N0, M0) non-small cell lung cancer, invasive poorly differentiated adenocarcinoma, 65% acinar, 20% solid and 10% lipidic, 5% cribriform presented with right upper lobe lung nodule diagnosed in November 2022 .  PRIOR THERAPY: Status post right upper lobectomy with lymph node dissection under the care of Dr. Olympia Bianchi at [facility] on March 23, 2021.  The tumor size was 2.3 cm with negative visceral pleural or lymphovascular invasion.  She has positive EGFR mutation in exon 21 (L858R)  CURRENT THERAPY: Observation.  INTERVAL HISTORY: Donna Todd 68 y.o. female returns to the clinic today for follow-up visit.  Discussed the use of AI scribe software for clinical note transcription with the patient, who gave verbal consent to proceed.  History of Present Illness   Donna Todd is a 68 year old female with stage 1A non-small cell lung cancer who presents for follow-up and monitoring.  She was diagnosed with stage 1A non-small cell lung cancer, adenocarcinoma, in November 2022, with a positive EGFR mutation on exon 21 (L858R). She is currently under observation. A repeat CT scan of the chest was scheduled before this visit to monitor for any progression or new lesions, but it was not completed. She does not recall being contacted for the scan and has been busy, which has affected her memory.  She has experienced a recent cough, which is now clearing up. She continues to work and mentions having memory problems, which she attributes to being busy. Blood work was completed to ensure her kidney function and blood count are adequate for the scan.        MEDICAL HISTORY: Past Medical History:  Diagnosis Date   Attention deficit disorder    Depression     ALLERGIES:  is allergic  to sulfa antibiotics.  MEDICATIONS:  Current Outpatient Medications  Medication Sig Dispense Refill   ALPRAZolam (XANAX) 0.5 MG tablet 1/2 to 1 tablet Orally Twice a day As needed     Biotin w/ Vitamins C & E (HAIR/SKIN/NAILS PO) Take 1 tablet by mouth daily.     buPROPion (WELLBUTRIN XL) 150 MG 24 hr tablet 1 tablet in the morning Orally Once a day     cyanocobalamin 1000 MCG tablet Take 1 tablet by mouth daily.     DULoxetine  (CYMBALTA ) 60 MG capsule Take 60 mg by mouth daily.     escitalopram (LEXAPRO) 10 MG tablet Take 10 mg by mouth daily.     fluorouracil  (EFUDEX ) 5 % cream Apply topically 2 (two) times daily. 40 g 2   gabapentin (NEURONTIN) 300 MG capsule Take 300 mg by mouth 3 (three) times daily.     HYDROcodone-acetaminophen  (NORCO/VICODIN) 5-325 MG tablet Take 1-2 tablets by mouth every 6 (six) hours as needed.     KRILL OIL PO Take 1 tablet by mouth daily.     naproxen (NAPROSYN) 500 MG tablet Take 500 mg by mouth 2 (two) times daily.     traZODone (DESYREL) 50 MG tablet Take 25-50 mg by mouth at bedtime as needed.     valACYclovir (VALTREX) 500 MG tablet Take 500 mg by mouth daily. (Patient not taking: Reported on 10/04/2021)     varenicline (CHANTIX) 1 MG tablet Take 1 mg by mouth 2 (two) times daily.     No current facility-administered medications  for this visit.    SURGICAL HISTORY:  Past Surgical History:  Procedure Laterality Date   ABDOMINAL HYSTERECTOMY      REVIEW OF SYSTEMS:  A comprehensive review of systems was negative except for: Respiratory: positive for pleurisy/chest pain Neurological: positive for memory problems   PHYSICAL EXAMINATION: General appearance: alert, cooperative, appears stated age, and no distress Head: Normocephalic, without obvious abnormality, atraumatic Neck: no adenopathy, no JVD, supple, symmetrical, trachea midline, and thyroid not enlarged, symmetric, no tenderness/mass/nodules Lymph nodes: Cervical, supraclavicular, and axillary  nodes normal. Resp: clear to auscultation bilaterally Back: symmetric, no curvature. ROM normal. No CVA tenderness. Cardio: regular rate and rhythm, S1, S2 normal, no murmur, click, rub or gallop GI: soft, non-tender; bowel sounds normal; no masses,  no organomegaly Extremities: extremities normal, atraumatic, no cyanosis or edema  ECOG PERFORMANCE STATUS: 1 - Symptomatic but completely ambulatory  Blood pressure (!) 113/46, pulse (!) 54, temperature 98.1 F (36.7 C), temperature source Temporal, resp. rate 16, height 5\' 4"  (1.626 m), weight 109 lb 4.8 oz (49.6 kg), SpO2 99%.  LABORATORY DATA: Lab Results  Component Value Date   WBC 6.1 06/21/2023   HGB 13.1 06/21/2023   HCT 38.2 06/21/2023   MCV 90.5 06/21/2023   PLT 260 06/21/2023      Chemistry      Component Value Date/Time   NA 139 06/21/2023 1154   K 4.7 06/21/2023 1154   CL 106 06/21/2023 1154   CO2 29 06/21/2023 1154   BUN 13 06/21/2023 1154   CREATININE 0.92 06/21/2023 1154      Component Value Date/Time   CALCIUM 9.4 06/21/2023 1154   ALKPHOS 44 06/21/2023 1154   AST 17 06/21/2023 1154   ALT 11 06/21/2023 1154   BILITOT 0.4 06/21/2023 1154       RADIOGRAPHIC STUDIES:   ASSESSMENT AND PLAN: This is a very pleasant 68 years old white female recently diagnosed with stage IA (T1c, N0, M0) non-small cell lung cancer, invasive poorly differentiated adenocarcinoma diagnosed in November 2022 status post right upper lobectomy with lymph node dissection on March 23, 2021 under the care of Dr. Olympia Bianchi.  The resected tumor measured 2.3 cm with negative lymph node dissection as well as no evidence for visceral pleural or lymphovascular invasion and it was positive for EGFR mutation in exon 21 ([id number]).   The patient is noncompliant with her imaging studies or follow-up visits. She missed the appointment for her scan to be done before this visit. I will try to arrange for her scan to be done within the next 1-2 weeks and  then she will follow-up with her primary care physician from now on and see me on as-needed basis. Assessment and Plan   Stage 1A non-small cell lung cancer Adenocarcinoma with positive EGFR mutation (exon 21, L858R). Currently under observation. No recent CT scan available to assess for progression. Reports recent cough, which is resolving. Emphasized the importance of regular CT scans for monitoring. - Order chest CT scan to assess for progression - Coordinate with family physician for future scan management and referral if abnormalities are detected  Memory problem Experiencing difficulty remembering to schedule and attend medical appointments, impacting lung cancer monitoring. - Instruct nurse to assist in scheduling CT scan - Discuss with family physician the possibility of managing scan scheduling   She was advised to call if she has any concerning symptoms. The patient voices understanding of current disease status and treatment options and is in agreement with the  current care plan.  All questions were answered. The patient knows to call the clinic with any problems, questions or concerns. We can certainly see the patient much sooner if necessary.  The total time spent in the appointment was 20 minutes.  Disclaimer: This note was dictated with voice recognition software. Similar sounding words can inadvertently be transcribed and may not be corrected upon review.

## 2023-07-04 ENCOUNTER — Ambulatory Visit (HOSPITAL_COMMUNITY)
Admission: RE | Admit: 2023-07-04 | Discharge: 2023-07-04 | Disposition: A | Discharge status: Home / Self Care | Source: Ambulatory Visit | Attending: Internal Medicine | Admitting: Internal Medicine

## 2023-07-04 DIAGNOSIS — R7309 Other abnormal glucose: Secondary | ICD-10-CM | POA: Diagnosis not present

## 2023-07-04 DIAGNOSIS — M8588 Other specified disorders of bone density and structure, other site: Secondary | ICD-10-CM | POA: Diagnosis not present

## 2023-07-04 DIAGNOSIS — M8580 Other specified disorders of bone density and structure, unspecified site: Secondary | ICD-10-CM | POA: Diagnosis not present

## 2023-07-04 DIAGNOSIS — I700 Atherosclerosis of aorta: Secondary | ICD-10-CM | POA: Diagnosis not present

## 2023-07-04 DIAGNOSIS — Z720 Tobacco use: Secondary | ICD-10-CM | POA: Diagnosis not present

## 2023-07-04 DIAGNOSIS — R413 Other amnesia: Secondary | ICD-10-CM | POA: Diagnosis not present

## 2023-07-04 DIAGNOSIS — J439 Emphysema, unspecified: Secondary | ICD-10-CM | POA: Diagnosis not present

## 2023-07-04 DIAGNOSIS — Z85118 Personal history of other malignant neoplasm of bronchus and lung: Secondary | ICD-10-CM | POA: Diagnosis not present

## 2023-07-04 DIAGNOSIS — C3490 Malignant neoplasm of unspecified part of unspecified bronchus or lung: Secondary | ICD-10-CM | POA: Insufficient documentation

## 2023-07-04 DIAGNOSIS — E785 Hyperlipidemia, unspecified: Secondary | ICD-10-CM | POA: Diagnosis not present

## 2023-07-04 DIAGNOSIS — Z0000 Encounter for general adult medical examination without abnormal findings: Secondary | ICD-10-CM | POA: Diagnosis not present

## 2023-07-04 MED ORDER — IOHEXOL 350 MG/ML SOLN
75.0000 mL | Freq: Once | INTRAVENOUS | Status: AC | PRN
  Administered 2023-07-04: 75 mL via INTRAVENOUS

## 2023-08-16 ENCOUNTER — Other Ambulatory Visit: Payer: Self-pay | Admitting: Family Medicine

## 2023-08-16 DIAGNOSIS — E279 Disorder of adrenal gland, unspecified: Secondary | ICD-10-CM

## 2023-08-22 DIAGNOSIS — E785 Hyperlipidemia, unspecified: Secondary | ICD-10-CM | POA: Diagnosis not present

## 2023-08-24 DIAGNOSIS — H524 Presbyopia: Secondary | ICD-10-CM | POA: Diagnosis not present

## 2023-08-24 DIAGNOSIS — H5213 Myopia, bilateral: Secondary | ICD-10-CM | POA: Diagnosis not present

## 2023-08-28 DIAGNOSIS — M65331 Trigger finger, right middle finger: Secondary | ICD-10-CM | POA: Diagnosis not present

## 2023-08-28 DIAGNOSIS — M180 Bilateral primary osteoarthritis of first carpometacarpal joints: Secondary | ICD-10-CM | POA: Diagnosis not present

## 2023-09-17 DIAGNOSIS — B070 Plantar wart: Secondary | ICD-10-CM | POA: Diagnosis not present

## 2023-09-17 DIAGNOSIS — M21621 Bunionette of right foot: Secondary | ICD-10-CM | POA: Diagnosis not present

## 2023-09-17 DIAGNOSIS — I739 Peripheral vascular disease, unspecified: Secondary | ICD-10-CM | POA: Diagnosis not present

## 2023-09-17 DIAGNOSIS — M7731 Calcaneal spur, right foot: Secondary | ICD-10-CM | POA: Diagnosis not present

## 2023-10-01 DIAGNOSIS — M7731 Calcaneal spur, right foot: Secondary | ICD-10-CM | POA: Diagnosis not present

## 2023-10-01 DIAGNOSIS — B070 Plantar wart: Secondary | ICD-10-CM | POA: Diagnosis not present

## 2023-10-17 DIAGNOSIS — B070 Plantar wart: Secondary | ICD-10-CM | POA: Diagnosis not present

## 2023-10-17 DIAGNOSIS — M7731 Calcaneal spur, right foot: Secondary | ICD-10-CM | POA: Diagnosis not present

## 2023-11-20 ENCOUNTER — Other Ambulatory Visit: Payer: Self-pay | Admitting: Family Medicine

## 2023-11-20 DIAGNOSIS — Z1231 Encounter for screening mammogram for malignant neoplasm of breast: Secondary | ICD-10-CM

## 2023-11-28 ENCOUNTER — Ambulatory Visit
Admission: RE | Admit: 2023-11-28 | Discharge: 2023-11-28 | Disposition: A | Discharge status: Home / Self Care | Source: Ambulatory Visit

## 2023-11-28 DIAGNOSIS — Z1231 Encounter for screening mammogram for malignant neoplasm of breast: Secondary | ICD-10-CM | POA: Diagnosis not present

## 2024-01-02 DIAGNOSIS — M8580 Other specified disorders of bone density and structure, unspecified site: Secondary | ICD-10-CM | POA: Diagnosis not present

## 2024-01-02 DIAGNOSIS — Z720 Tobacco use: Secondary | ICD-10-CM | POA: Diagnosis not present

## 2024-01-02 DIAGNOSIS — E785 Hyperlipidemia, unspecified: Secondary | ICD-10-CM | POA: Diagnosis not present

## 2024-02-17 ENCOUNTER — Other Ambulatory Visit

## 2024-03-11 ENCOUNTER — Telehealth: Payer: Self-pay

## 2024-03-11 NOTE — Telephone Encounter (Signed)
 Patient called inquiring whether a follow-up appointment is needed with Dr. Sherrod. Chart review shows patient was last seen on 06/27/23, and a CT scan was completed in May 2025. Patient was informed that per Dr. Blas note dated 06/27/23, the plan was to order a CT scan and coordinate future scan management with the family physician and place a referral if abnormalities are detected.  Patient reports she does not recall having any additional CT scans ordered or completed through her family physician, Donna Todd, and states that this provider is no longer in her insurance network. Patient reports selecting a new primary care provider through her [employer] portal, Donna Todd.  Patient was advised to contact Dr. Ramonia office to notify them of the change in primary care provider and inquire about transferring care and records to Dr. Shutter. Patient verbalized understanding.  Patient was informed that this nurse will follow up with Dr. Sherrod regarding recommended follow-up care and will contact patient with further instructions.

## 2024-03-13 NOTE — Telephone Encounter (Signed)
 Tried to reach patient in regards to Dr. Jeannett recommendations.  LVM with patient stating that Dr. Sherrod will be glad to see her with labs and a scan prior. Asked for a return call with any concerns or questions.

## 2024-03-24 ENCOUNTER — Other Ambulatory Visit: Payer: Self-pay

## 2024-03-24 DIAGNOSIS — C3490 Malignant neoplasm of unspecified part of unspecified bronchus or lung: Secondary | ICD-10-CM

## 2024-03-24 NOTE — Telephone Encounter (Signed)
 Spoke with patient in regards to Dr. Jeannett recommendations on CT scan.  Informed patient that CT scan will be of the chest abdomen and pelvis.  Orders are placed.  Advised patient of number to call and schedule scan.  Advised patient to call our office back once scheduled and a follow up with Dr. Sherrod or Worthington, PA will be made.  She voiced understanding.

## 2024-04-01 ENCOUNTER — Telehealth: Payer: Self-pay | Admitting: Medical Oncology

## 2024-04-01 ENCOUNTER — Ambulatory Visit (HOSPITAL_COMMUNITY)
Admission: RE | Admit: 2024-04-01 | Discharge: 2024-04-01 | Disposition: A | Discharge status: Home / Self Care | Source: Ambulatory Visit | Attending: Internal Medicine | Admitting: Internal Medicine

## 2024-04-01 DIAGNOSIS — C3490 Malignant neoplasm of unspecified part of unspecified bronchus or lung: Secondary | ICD-10-CM | POA: Diagnosis present

## 2024-04-01 MED ORDER — IOHEXOL 300 MG/ML  SOLN
80.0000 mL | Freq: Once | INTRAMUSCULAR | Status: AC | PRN
  Administered 2024-04-01: 80 mL via INTRAVENOUS

## 2024-04-01 NOTE — Telephone Encounter (Signed)
 Pt getting texts from [facility] about her CT scan.   I LVM that her CT scan is scheduled today at [facility] and after speaking with [facility] , the staff did not know why she was getting texts from them and was sorry.

## 2024-04-03 NOTE — Progress Notes (Unsigned)
 SABRA

## 2024-04-03 NOTE — Telephone Encounter (Signed)
 Spoke with patient in regards to follow up appointment to review CT scan results.  Offered patient appt on 04/06/24 @ 2 PM with Cassie, PA.  She accepted and voiced understanding.

## 2024-04-06 ENCOUNTER — Inpatient Hospital Stay: Admitting: Internal Medicine

## 2024-04-06 DIAGNOSIS — C3490 Malignant neoplasm of unspecified part of unspecified bronchus or lung: Secondary | ICD-10-CM

## 2024-09-28 ENCOUNTER — Inpatient Hospital Stay

## 2024-10-05 ENCOUNTER — Inpatient Hospital Stay: Admitting: Internal Medicine
